# Patient Record
Sex: Female | Born: 1980 | Race: Black or African American | Hispanic: No | Marital: Married | Smoking: Never smoker
Health system: Southern US, Community
[De-identification: ages and names within clinical notes are randomized; demographics above are authoritative.]

## PROBLEM LIST (undated history)

## (undated) DIAGNOSIS — T8859XA Other complications of anesthesia, initial encounter: Secondary | ICD-10-CM

## (undated) DIAGNOSIS — N83209 Unspecified ovarian cyst, unspecified side: Secondary | ICD-10-CM

## (undated) DIAGNOSIS — D649 Anemia, unspecified: Secondary | ICD-10-CM

## (undated) DIAGNOSIS — E282 Polycystic ovarian syndrome: Secondary | ICD-10-CM

## (undated) DIAGNOSIS — K219 Gastro-esophageal reflux disease without esophagitis: Secondary | ICD-10-CM

## (undated) DIAGNOSIS — G709 Myoneural disorder, unspecified: Secondary | ICD-10-CM

## (undated) DIAGNOSIS — M1990 Unspecified osteoarthritis, unspecified site: Secondary | ICD-10-CM

## (undated) DIAGNOSIS — F419 Anxiety disorder, unspecified: Secondary | ICD-10-CM

## (undated) DIAGNOSIS — E079 Disorder of thyroid, unspecified: Secondary | ICD-10-CM

## (undated) DIAGNOSIS — I499 Cardiac arrhythmia, unspecified: Secondary | ICD-10-CM

## (undated) DIAGNOSIS — K829 Disease of gallbladder, unspecified: Secondary | ICD-10-CM

## (undated) HISTORY — PX: CHOLECYSTECTOMY: SHX55

## (undated) HISTORY — PX: WISDOM TOOTH EXTRACTION: SHX21

---

## 2010-06-26 ENCOUNTER — Inpatient Hospital Stay (HOSPITAL_COMMUNITY)
Admission: AD | Admit: 2010-06-26 | Discharge: 2010-06-26 | Payer: Self-pay | Source: Home / Self Care | Attending: Obstetrics & Gynecology | Admitting: Obstetrics & Gynecology

## 2010-06-29 LAB — CBC
HCT: 32.5 % — ABNORMAL LOW (ref 36.0–46.0)
MCV: 76.7 fL — ABNORMAL LOW (ref 78.0–100.0)
Platelets: 285 10*3/uL (ref 150–400)
RBC: 4.24 MIL/uL (ref 3.87–5.11)
WBC: 9.3 10*3/uL (ref 4.0–10.5)

## 2010-06-29 LAB — URINE MICROSCOPIC-ADD ON

## 2010-06-29 LAB — URINALYSIS, ROUTINE W REFLEX MICROSCOPIC
Nitrite: NEGATIVE
Specific Gravity, Urine: >1.03 — ABNORMAL HIGH (ref 1.005–1.030)
pH: 5.5 (ref 5.0–8.0)

## 2010-06-29 LAB — POCT PREGNANCY, URINE: Preg Test, Ur: POSITIVE

## 2010-06-29 LAB — WET PREP, GENITAL

## 2010-06-29 LAB — HCG, QUANTITATIVE, PREGNANCY: hCG, Beta Chain, Quant, S: 11155 m[IU]/mL — ABNORMAL HIGH (ref <5)

## 2010-06-30 ENCOUNTER — Inpatient Hospital Stay (HOSPITAL_COMMUNITY)
Admission: AD | Admit: 2010-06-30 | Discharge: 2010-06-30 | Payer: Self-pay | Source: Home / Self Care | Attending: Obstetrics & Gynecology | Admitting: Obstetrics & Gynecology

## 2010-06-30 ENCOUNTER — Emergency Department (HOSPITAL_BASED_OUTPATIENT_CLINIC_OR_DEPARTMENT_OTHER)
Admission: EM | Admit: 2010-06-30 | Discharge: 2010-07-01 | Payer: Self-pay | Source: Home / Self Care | Admitting: Emergency Medicine

## 2010-06-30 LAB — GC/CHLAMYDIA PROBE AMP, GENITAL
Chlamydia, DNA Probe: NEGATIVE
GC Probe Amp, Genital: NEGATIVE

## 2010-07-03 ENCOUNTER — Ambulatory Visit (HOSPITAL_COMMUNITY): Admission: RE | Admit: 2010-07-03 | Payer: Self-pay | Source: Home / Self Care | Admitting: Obstetrics & Gynecology

## 2010-07-08 ENCOUNTER — Encounter: Payer: Self-pay | Admitting: Obstetrics & Gynecology

## 2010-07-14 ENCOUNTER — Other Ambulatory Visit (HOSPITAL_COMMUNITY): Payer: Self-pay | Admitting: Obstetrics and Gynecology

## 2010-07-14 DIAGNOSIS — Z3682 Encounter for antenatal screening for nuchal translucency: Secondary | ICD-10-CM

## 2010-08-18 ENCOUNTER — Ambulatory Visit (HOSPITAL_COMMUNITY): Payer: Medicaid Other

## 2011-04-28 ENCOUNTER — Emergency Department (HOSPITAL_COMMUNITY)
Admission: EM | Admit: 2011-04-28 | Discharge: 2011-04-28 | Disposition: A | Discharge status: Home / Self Care | Payer: Medicaid Other | Attending: Emergency Medicine | Admitting: Emergency Medicine

## 2011-04-28 ENCOUNTER — Other Ambulatory Visit: Payer: Self-pay

## 2011-04-28 ENCOUNTER — Emergency Department (HOSPITAL_COMMUNITY): Payer: Medicaid Other

## 2011-04-28 DIAGNOSIS — R079 Chest pain, unspecified: Secondary | ICD-10-CM | POA: Insufficient documentation

## 2011-04-28 DIAGNOSIS — R002 Palpitations: Secondary | ICD-10-CM | POA: Insufficient documentation

## 2011-04-28 LAB — POCT I-STAT, CHEM 8
Calcium, Ion: 1.23 mmol/L (ref 1.12–1.32)
Chloride: 105 mEq/L (ref 96–112)
Creatinine, Ser: 1.2 mg/dL — ABNORMAL HIGH (ref 0.50–1.10)
Glucose, Bld: 91 mg/dL (ref 70–99)
HCT: 35 % — ABNORMAL LOW (ref 36.0–46.0)

## 2011-04-28 LAB — POCT I-STAT TROPONIN I: Troponin i, poc: 0 ng/mL (ref 0.00–0.08)

## 2011-04-28 LAB — D-DIMER, QUANTITATIVE: D-Dimer, Quant: 0.38 ug/mL-FEU (ref 0.00–0.48)

## 2011-04-28 MED ORDER — GI COCKTAIL ~~LOC~~
30.0000 mL | Freq: Once | ORAL | Status: AC
  Administered 2011-04-28: 30 mL via ORAL
  Filled 2011-04-28: qty 30

## 2011-04-28 NOTE — ED Provider Notes (Signed)
History    This is a healthy patient with no history of heart disease is presenting to the ED with the chief complaints of chest pressure and palpitations. Patient states, since after her pregnancy in September, she has been noticing heart palpitations and occasional chest pressure. Symptom is more noticeable when she is laying down at night. Sensation is affecting her epigastric region. Symptom usually lasts for about an hour. Patient denies fever, shortness of breath, and nausea, vomiting, diarrhea, abdominal pain, rash and, recent trauma. She has been seen by her primary care doctor and has a Holter monitor for about 2 weeks. She also mentioned that she has a cardiac ultrasound performed last week, but does not know the result.  Today, while sitting at work, she noticed increase chest discomfort which prompted her to come to the ED. Chest pain is nonexertional and does not increase with movement.    CSN: [id number] Arrival date & time: 04/28/2011  4:18 PM   First MD Initiated Contact with Patient 04/28/11 1621      No chief complaint on file.   (Consider location/radiation/quality/duration/timing/severity/associated sxs/prior treatment) Patient is a 30 y.o. female presenting with chest pain. The history is provided by the patient. No language interpreter was used.  Chest Pain The chest pain began 1 - 2 hours ago. Chest pain occurs rarely. The chest pain is improving. At its most intense, the pain is at 8/10. The pain is currently at 2/10. The severity of the pain is moderate. The quality of the pain is described as pressure-like. The pain does not radiate. Chest pain is worsened by stress. Primary symptoms include palpitations. Pertinent negatives for primary symptoms include no fever, no shortness of breath, no cough, no wheezing, no abdominal pain, no nausea and no vomiting.  The palpitations did not occur with shortness of breath.   Pertinent negatives for associated symptoms include no  diaphoresis and no near-syncope. She tried nothing for the symptoms.  Her family medical history is significant for heart disease in family.     No past medical history on file.  No past surgical history on file.  No family history on file.  History  Substance Use Topics  . Smoking status: Not on file  . Smokeless tobacco: Not on file  . Alcohol Use: Not on file    OB History    No data available      Review of Systems  Constitutional: Negative for fever and diaphoresis.  Respiratory: Negative for cough, shortness of breath and wheezing.   Cardiovascular: Positive for chest pain and palpitations. Negative for near-syncope.  Gastrointestinal: Negative for nausea, vomiting and abdominal pain.  All other systems reviewed and are negative.    Allergies  Review of patient's allergies indicates not on file.  Home Medications  No current outpatient prescriptions on file.  There were no vitals taken for this visit.  Physical Exam  Constitutional: She is oriented to person, place, and time. She appears well-developed and well-nourished. No distress.  HENT:  Head: Normocephalic and atraumatic.  Eyes: Conjunctivae are normal.  Neck: Normal range of motion. Neck supple.  Cardiovascular: Normal rate and regular rhythm.  Exam reveals no gallop and no friction rub.   No murmur heard. Pulmonary/Chest: Effort normal and breath sounds normal. No respiratory distress. She has no wheezes. She exhibits no tenderness.  Abdominal: Soft. Bowel sounds are normal. There is no tenderness.  Musculoskeletal: Normal range of motion.  Neurological: She is alert and oriented to person, place, and  time.    ED Course  Procedures (including critical care time)  Labs Reviewed - No data to display No results found.   No diagnosis found.    MDM  Patient chest pain is likely noncardiac related due to her age and presentation. She does appear to be stressed, as her mom passed away at an  early age due to cardiac related disease.    7:42 PM Normal ECG, CXR, CBC, BMP, troponin, D-dimer.  Pt is not having active pain.  Will discharge and have pt fu with her doctor.  Pt voice understanding.  VSS.       Fayrene Helper, PA 04/28/11 1943  Fayrene Helper, PA 04/28/11 1946

## 2011-04-28 NOTE — ED Provider Notes (Signed)
Medical screening examination/treatment/procedure(s) were performed by non-physician practitioner and as supervising physician I was immediately available for consultation/collaboration.  Talana Slatten K Jaydn Fincher-Rasch, MD 04/28/11 2051 

## 2011-04-28 NOTE — Discharge Instructions (Signed)
Chest Pain (Nonspecific) It is often hard to give a specific diagnosis for the cause of chest pain. There is always a chance that your pain could be related to something serious, such as a heart attack or a blood clot in the lungs. You need to follow up with your caregiver for further evaluation. CAUSES   Heartburn.   Pneumonia or bronchitis.   Anxiety and stress.   Inflammation around your heart (pericarditis) or lung (pleuritis or pleurisy).   A blood clot in the lung.   A collapsed lung (pneumothorax). It can develop suddenly on its own (spontaneous pneumothorax) or from injury (trauma) to the chest.  The chest wall is composed of bones, muscles, and cartilage. Any of these can be the source of the pain.  The bones can be bruised by injury.   The muscles or cartilage can be strained by coughing or overwork.   The cartilage can be affected by inflammation and become sore (costochondritis).  DIAGNOSIS  Lab tests or other studies, such as X-rays, an EKG, stress testing, or cardiac imaging, may be needed to find the cause of your pain.  TREATMENT   Treatment depends on what may be causing your chest pain. Treatment may include:   Acid blockers for heartburn.   Anti-inflammatory medicine.   Pain medicine for inflammatory conditions.   Antibiotics if an infection is present.   You may be advised to change lifestyle habits. This includes stopping smoking and avoiding caffeine and chocolate.   You may be advised to keep your head raised (elevated) when sleeping. This reduces the chance of acid going backward from your stomach into your esophagus.   Most of the time, nonspecific chest pain will improve within 2 to 3 days with rest and mild pain medicine.  HOME CARE INSTRUCTIONS   If antibiotics were prescribed, take the full amount even if you start to feel better.   For the next few days, avoid physical activities that bring on chest pain. Continue physical activities as  directed.   Do not smoke cigarettes or drink alcohol until your symptoms are gone.   Only take over-the-counter or prescription medicine for pain, discomfort, or fever as directed by your caregiver.   Follow your caregiver's suggestions for further testing if your chest pain does not go away.   Keep any follow-up appointments you made. If you do not go to an appointment, you could develop lasting (chronic) problems with pain. If there is any problem keeping an appointment, you must call to reschedule.  SEEK MEDICAL CARE IF:   You think you are having problems from the medicine you are taking. Read your medicine instructions carefully.   Your chest pain does not go away, even after treatment.   You develop a rash with blisters on your chest.  SEEK IMMEDIATE MEDICAL CARE IF:   You have increased chest pain or pain that spreads to your arm, neck, jaw, back, or belly (abdomen).   You develop shortness of breath, an increasing cough, or you are coughing up blood.   You have severe back or abdominal pain, feel sick to your stomach (nauseous) or throw up (vomit).   You develop severe weakness, fainting, or chills.   You have an oral temperature above 102 F (38.9 C), not controlled by medicine.  THIS IS AN EMERGENCY. Do not wait to see if the pain will go away. Get medical help at once. Call your local emergency services (911 in [place] Do not drive yourself to   the hospital. MAKE SURE YOU:   Understand these instructions.   Will watch your condition.   Will get help right away if you are not doing well or get worse.  Document Released: 03/03/2005 Document Revised: 02/03/2011 Document Reviewed: 12/28/2007 [employer] Patient Information 2012 [employer]. 

## 2011-09-20 ENCOUNTER — Emergency Department (INDEPENDENT_AMBULATORY_CARE_PROVIDER_SITE_OTHER): Payer: Self-pay

## 2011-09-20 ENCOUNTER — Emergency Department (HOSPITAL_BASED_OUTPATIENT_CLINIC_OR_DEPARTMENT_OTHER)
Admission: EM | Admit: 2011-09-20 | Discharge: 2011-09-21 | Disposition: A | Discharge status: Home / Self Care | Payer: Self-pay | Attending: Emergency Medicine | Admitting: Emergency Medicine

## 2011-09-20 ENCOUNTER — Encounter (HOSPITAL_BASED_OUTPATIENT_CLINIC_OR_DEPARTMENT_OTHER): Payer: Self-pay | Admitting: *Deleted

## 2011-09-20 DIAGNOSIS — K8050 Calculus of bile duct without cholangitis or cholecystitis without obstruction: Secondary | ICD-10-CM

## 2011-09-20 DIAGNOSIS — K8020 Calculus of gallbladder without cholecystitis without obstruction: Secondary | ICD-10-CM | POA: Insufficient documentation

## 2011-09-20 DIAGNOSIS — R1011 Right upper quadrant pain: Secondary | ICD-10-CM | POA: Insufficient documentation

## 2011-09-20 HISTORY — DX: Unspecified ovarian cyst, unspecified side: N83.209

## 2011-09-20 LAB — DIFFERENTIAL
Eosinophils Relative: 1 % (ref 0–5)
Lymphocytes Relative: 19 % (ref 12–46)
Lymphs Abs: 2.8 10*3/uL (ref 0.7–4.0)
Monocytes Absolute: 0.7 10*3/uL (ref 0.1–1.0)
Monocytes Relative: 5 % (ref 3–12)
Neutro Abs: 11.2 10*3/uL — ABNORMAL HIGH (ref 1.7–7.7)

## 2011-09-20 LAB — URINE MICROSCOPIC-ADD ON

## 2011-09-20 LAB — COMPREHENSIVE METABOLIC PANEL
AST: 17 U/L (ref 0–37)
BUN: 8 mg/dL (ref 6–23)
CO2: 27 mEq/L (ref 19–32)
Chloride: 103 mEq/L (ref 96–112)
Creatinine, Ser: 1.1 mg/dL (ref 0.50–1.10)
GFR calc Af Amer: 77 mL/min — ABNORMAL LOW (ref >90)
GFR calc non Af Amer: 67 mL/min — ABNORMAL LOW (ref >90)
Glucose, Bld: 103 mg/dL — ABNORMAL HIGH (ref 70–99)
Total Bilirubin: 0.4 mg/dL (ref 0.3–1.2)

## 2011-09-20 LAB — CBC
HCT: 33.9 % — ABNORMAL LOW (ref 36.0–46.0)
Hemoglobin: 11.2 g/dL — ABNORMAL LOW (ref 12.0–15.0)
MCV: 75.3 fL — ABNORMAL LOW (ref 78.0–100.0)
WBC: 14.9 10*3/uL — ABNORMAL HIGH (ref 4.0–10.5)

## 2011-09-20 LAB — URINALYSIS, ROUTINE W REFLEX MICROSCOPIC
Bilirubin Urine: NEGATIVE
Hgb urine dipstick: NEGATIVE
Ketones, ur: NEGATIVE mg/dL
Nitrite: NEGATIVE
Specific Gravity, Urine: 1.022 (ref 1.005–1.030)
pH: 8 (ref 5.0–8.0)

## 2011-09-20 LAB — LIPASE, BLOOD: Lipase: 13 U/L (ref 11–59)

## 2011-09-20 MED ORDER — SODIUM CHLORIDE 0.9 % IV SOLN
1000.0000 mL | Freq: Once | INTRAVENOUS | Status: AC
  Administered 2011-09-20: 1000 mL via INTRAVENOUS

## 2011-09-20 MED ORDER — SODIUM CHLORIDE 0.9 % IV SOLN
1000.0000 mL | INTRAVENOUS | Status: DC
  Administered 2011-09-21: 1000 mL via INTRAVENOUS

## 2011-09-20 MED ORDER — ONDANSETRON HCL 4 MG/2ML IJ SOLN
4.0000 mg | Freq: Once | INTRAMUSCULAR | Status: AC
  Administered 2011-09-20: 4 mg via INTRAVENOUS
  Filled 2011-09-20: qty 2

## 2011-09-20 MED ORDER — MORPHINE SULFATE 4 MG/ML IJ SOLN
4.0000 mg | Freq: Once | INTRAMUSCULAR | Status: AC
  Administered 2011-09-20: 4 mg via INTRAVENOUS
  Filled 2011-09-20: qty 1

## 2011-09-20 NOTE — ED Notes (Signed)
Pt took MOM at 2pm. Has had 3 BMs since that time.

## 2011-09-20 NOTE — ED Provider Notes (Signed)
This chart was scribed for Kendra Numbers, MD by Wallis Mart. The patient was seen in room MH12/MH12 and the patient's care was started at 10:59 PM.   CSN: [id number]  Arrival date & time 09/20/11  2026   First MD Initiated Contact with Patient 09/20/11 2241      No chief complaint on file.   (Consider location/radiation/quality/duration/timing/severity/associated sxs/prior treatment) HPI  Kendra Fowler is a 31 y.o. female who presents to the Emergency Department complaining of sudden onset, intermittent, gradually worsening abd pain onset this morning. Pt rates the pain as a 7.5/10 and it radiates to her back. Pt c/o of nausea but denies vomiting. Pt took gas ex with mild temporary relief of sx. Pt also took a laxative but denied any constipation, had 2 BM w/ no relief.  Pt denies diarrhea, bloody stools, dysuria. Pt denies any problems with gallbladder or abd surgeries.  There are no other associated symptoms and no other alleviating or aggravating factors.   Past Medical History  Diagnosis Date  . Ovarian cyst     History reviewed. No pertinent past surgical history.  No family history on file.  History  Substance Use Topics  . Smoking status: Never Smoker   . Smokeless tobacco: Not on file  . Alcohol Use: No    OB History    Grav Para Term Preterm Abortions TAB SAB Ect Mult Living                  Review of Systems  Constitutional: Positive for appetite change and fatigue.  HENT: Negative.   Eyes: Negative.   Respiratory: Negative.   Cardiovascular: Negative.   Gastrointestinal: Positive for nausea and abdominal pain.  Genitourinary: Negative.   Musculoskeletal: Negative.   Skin: Negative.   Neurological: Negative.   Hematological: Negative.   Psychiatric/Behavioral: Negative.   All other systems reviewed and are negative.    10 Systems reviewed and all are negative for acute change except as noted in the HPI.    Allergies  Penicillins and  Lobster  Home Medications   Current Outpatient Rx  Name Route Sig Dispense Refill  . THERA M PLUS PO TABS Oral Take 1 tablet by mouth daily.        BP 153/97  Pulse 107  Temp(Src) 99.6 F (37.6 C) (Oral)  Resp 20  SpO2 100%  LMP 09/07/2011  Physical Exam  Nursing note and vitals reviewed. Constitutional: She is oriented to person, place, and time. She appears well-developed and well-nourished. No distress.  HENT:  Head: Normocephalic and atraumatic.  Eyes: EOM are normal. Pupils are equal, round, and reactive to light.  Neck: Normal range of motion. Neck supple. No tracheal deviation present.  Cardiovascular: Normal rate, regular rhythm and normal heart sounds.   Pulmonary/Chest: Effort normal and breath sounds normal. No respiratory distress.  Abdominal: Soft. Bowel sounds are normal. She exhibits no distension.       RUQ tenderness  Musculoskeletal: Normal range of motion. She exhibits no edema.  Neurological: She is alert and oriented to person, place, and time. No sensory deficit.  Skin: Skin is warm and dry.  Psychiatric: She has a normal mood and affect. Her behavior is normal.    ED Course  Procedures (including critical care time) DIAGNOSTIC STUDIES: Oxygen Saturation is 100% on room air, normal by my interpretation.    COORDINATION OF CARE:    Labs Reviewed  URINALYSIS, ROUTINE W REFLEX MICROSCOPIC - Abnormal; Notable for the following:  Leukocytes, UA TRACE (*)    All other components within normal limits  URINE MICROSCOPIC-ADD ON - Abnormal; Notable for the following:    Bacteria, UA FEW (*)    All other components within normal limits  CBC - Abnormal; Notable for the following:    WBC 14.9 (*)    Hemoglobin 11.2 (*)    HCT 33.9 (*)    MCV 75.3 (*)    MCH 24.9 (*)    RDW 16.1 (*)    All other components within normal limits  DIFFERENTIAL - Abnormal; Notable for the following:    Neutro Abs 11.2 (*)    All other components within normal limits    COMPREHENSIVE METABOLIC PANEL - Abnormal; Notable for the following:    Glucose, Bld 103 (*)    GFR calc non Af Amer 67 (*)    GFR calc Af Amer 77 (*)    All other components within normal limits  PREGNANCY, URINE  LIPASE, BLOOD   Ct Abdomen Pelvis W Contrast  09/21/2011  *RADIOLOGY REPORT*  Clinical Data: Right upper quadrant pain, nausea, and low grade fever for 1 day.  CT ABDOMEN AND PELVIS WITH CONTRAST  Technique:  Multidetector CT imaging of the abdomen and pelvis was performed following the standard protocol during bolus administration of intravenous contrast.  Contrast:  100 ml Omnipaque-300  Comparison: None.  Findings: The imaged lung bases are clear.  Noncalcified, gas containing gallstones are noted within the gallbladder.  No evidence of gallbladder wall thickening or pericholecystic fluid by CT.  The liver is within normal limits. There is no biliary ductal dilatation.  Common bile duct is within normal limits for caliber.  The spleen, adrenal glands, pancreas, and kidneys are within normal limits.  The stomach is well opacified with oral contrast appears normal. There is some oral contrast within proximal small bowel loops, but it has not progressed the distal small bowel that time imaging. Bowel loops are normal in caliber.  The appendix is within normal limits.  Urinary bladder is normal.  The uterus and ovaries within normal limits for premenopausal patient, with a probable dominant follicle in the right ovary.  No lymphadenopathy, free fluid, or free intraperitoneal air.  Small fat containing umbilical hernia noted.  There are no herniated bowel loops.  No acute or suspicious bony abnormality.  There is some edema within the subcutaneous fat of the back.  IMPRESSION:  1.  Cholelithiasis.  If there is clinical concern for cholecystitis, right upper quadrant ultrasound could be considered. 2.  Normal appendix.  Original Report Authenticated By: Britta Mccreedy, M.D.     1. Abdominal pain    2. Biliary colic   3. Cholelithiasis       MDM  Patient had history and physical consistent with possible biliary colic. Ultrasound was not available this evening. Laboratory workup showed a white count with no other significant findings. Patient felt much better after IV fluids, morphine, and Zofran. I did feel that CT of the abdomen and pelvis was warranted given the patient's leukocytosis. This showed cholelithiasis but no findings concerning for cholecystitis. This was discussed with the patient. She was comfortable with plan to return tomorrow for formal ultrasound of the right upper quadrant. Patient received one more dose of medications and was discharged with a prescription for Zofran and Percocet. She was told that if she develops fevers or other concerning symptoms she is welcome to return sooner. Patient was discharged in good condition with referral to return  for ultrasound. Referral information for [facility] surgery was also placed on her paperwork so that she might visit with them to discuss elective cholecystectomy.  I personally performed the services described in this documentation, which was scribed in my presence. The recorded information has been reviewed and considered.         Kendra Numbers, MD 09/21/11 [phone]

## 2011-09-20 NOTE — ED Notes (Signed)
Woke this am with pain around her umbilical area. Took a gas ex with some relief. Pain came back worse a few hours later. Had 2 bowel movements with no relief.

## 2011-09-21 ENCOUNTER — Other Ambulatory Visit (HOSPITAL_BASED_OUTPATIENT_CLINIC_OR_DEPARTMENT_OTHER): Payer: Medicaid Other

## 2011-09-21 ENCOUNTER — Ambulatory Visit (INDEPENDENT_AMBULATORY_CARE_PROVIDER_SITE_OTHER)
Admission: RE | Admit: 2011-09-21 | Discharge: 2011-09-21 | Disposition: A | Discharge status: Home / Self Care | Payer: Self-pay | Source: Ambulatory Visit | Attending: Emergency Medicine | Admitting: Emergency Medicine

## 2011-09-21 DIAGNOSIS — R1011 Right upper quadrant pain: Secondary | ICD-10-CM

## 2011-09-21 DIAGNOSIS — K8020 Calculus of gallbladder without cholecystitis without obstruction: Secondary | ICD-10-CM

## 2011-09-21 DIAGNOSIS — R509 Fever, unspecified: Secondary | ICD-10-CM

## 2011-09-21 DIAGNOSIS — R110 Nausea: Secondary | ICD-10-CM

## 2011-09-21 MED ORDER — ONDANSETRON HCL 4 MG/2ML IJ SOLN
4.0000 mg | Freq: Once | INTRAMUSCULAR | Status: AC
  Administered 2011-09-21: 4 mg via INTRAVENOUS
  Filled 2011-09-21: qty 2

## 2011-09-21 MED ORDER — MORPHINE SULFATE 4 MG/ML IJ SOLN
4.0000 mg | Freq: Once | INTRAMUSCULAR | Status: AC
  Administered 2011-09-21: 4 mg via INTRAVENOUS
  Filled 2011-09-21: qty 1

## 2011-09-21 MED ORDER — IOHEXOL 300 MG/ML  SOLN: 100.0000 mL | Freq: Once | INTRAMUSCULAR | Status: DC | PRN

## 2011-09-21 MED ORDER — OXYCODONE-ACETAMINOPHEN 5-325 MG PO TABS: 2.0000 | ORAL_TABLET | ORAL | Status: AC | PRN

## 2011-09-21 MED ORDER — IOHEXOL 300 MG/ML  SOLN: 20.0000 mL | INTRAMUSCULAR | Status: AC

## 2011-09-21 MED ORDER — ONDANSETRON 8 MG PO TBDP: 8.0000 mg | ORAL_TABLET | Freq: Three times a day (TID) | ORAL | Status: AC | PRN

## 2011-09-21 NOTE — Discharge Instructions (Signed)
Biliary Colic  Biliary colic is a steady or irregular pain in the upper abdomen. It is usually under the right side of the rib cage. It happens when gallstones interfere with the normal flow of bile from the gallbladder. Bile is a liquid that helps to digest fats. Bile is made in the liver and stored in the gallbladder. When you eat a meal, bile passes from the gallbladder through the cystic duct and the common bile duct into the small intestine. There, it mixes with partially digested food. If a gallstone blocks either of these ducts, the normal flow of bile is blocked. The muscle cells in the bile duct contract forcefully to try to move the stone. This causes the pain of biliary colic.  SYMPTOMS   A person with biliary colic usually complains of pain in the upper abdomen. This pain can be:   In the center of the upper abdomen just below the breastbone.   In the upper-right part of the abdomen, near the gallbladder and liver.   Spread back toward the right shoulder blade.   Nausea and vomiting.   The pain usually occurs after eating.   Biliary colic is usually triggered by the digestive system's demand for bile. The demand for bile is high after fatty meals. Symptoms can also occur when a person who has been fasting suddenly eats a very large meal. Most episodes of biliary colic pass after 1 to 5 hours. After the most intense pain passes, your abdomen may continue to ache mildly for about 24 hours.  DIAGNOSIS  After you describe your symptoms, your caregiver will perform a physical exam. He or she will pay attention to the upper right portion of your belly (abdomen). This is the area of your liver and gallbladder. An ultrasound will help your caregiver look for gallstones. Specialized scans of the gallbladder may also be done. Blood tests may be done, especially if you have fever or if your pain persists. PREVENTION  Biliary colic can be prevented by controlling the risk factors for gallstones.  Some of these risk factors, such as heredity, increasing age, and pregnancy are a normal part of life. Obesity and a high-fat diet are risk factors you can change through a healthy lifestyle. Women going through menopause who take hormone replacement therapy (estrogen) are also more likely to develop biliary colic. TREATMENT   Pain medication may be prescribed.   You may be encouraged to eat a fat-free diet.   If the first episode of biliary colic is severe, or episodes of colic keep retuning, surgery to remove the gallbladder (cholecystectomy) is usually recommended. This procedure can be done through small incisions using an instrument called a laparoscope. The procedure often requires a brief stay in the hospital. Some people can leave the hospital the same day. It is the most widely used treatment in people troubled by painful gallstones. It is effective and safe, with no complications in more than 90% of cases.   If surgery cannot be done, medication that dissolves gallstones may be used. This medication is expensive and can take months or years to work. Only small stones will dissolve.   Rarely, medication to dissolve gallstones is combined with a procedure called shock-wave lithotripsy. This procedure uses carefully aimed shock waves to break up gallstones. In many people treated with this procedure, gallstones form again within a few years.  PROGNOSIS  If gallstones block your cystic duct or common bile duct, you are at risk for repeated episodes of   biliary colic. There is also a 25% chance that you will develop a gallbladder infection(acute cholecystitis), or some other complication of gallstones within 10 to 20 years. If you have surgery, schedule it at a time that is convenient for you and at a time when you are not sick. HOME CARE INSTRUCTIONS   Drink plenty of clear fluids.   Avoid fatty, greasy or fried foods, or any foods that make your pain worse.   Take medications as directed.    SEEK MEDICAL CARE IF:   You develop a fever over 100.5 F (38.1 C).   Your pain gets worse over time.   You develop nausea that prevents you from eating and drinking.   You develop vomiting.  SEEK IMMEDIATE MEDICAL CARE IF:   You have continuous or severe belly (abdominal) pain which is not relieved with medications.   You develop nausea and vomiting which is not relieved with medications.   You have symptoms of biliary colic and you suddenly develop a fever and shaking chills. This may signal cholecystitis. Call your caregiver immediately.   You develop a yellow color to your skin or the white part of your eyes (jaundice).  Document Released: 10/25/2005 Document Revised: 05/13/2011 Document Reviewed: 01/04/2008 [facility] Patient Information 2012 [place], [place].Cholelithiasis Cholelithiasis (also called gallstones) is a form of gallbladder disease where gallstones form in your gallbladder. The gallbladder is a non-essential organ that stores bile made in the liver, which helps digest fats. Gallstones begin as small crystals and slowly grow into stones. Gallstone pain occurs when the gallbladder spasms, and a gallstone is blocking the duct. Pain can also occur when a stone passes out of the duct.  Women are more likely to develop gallstones than men. Other factors that increase the risk of gallbladder disease are:  Having multiple pregnancies. Physicians sometimes advise removing diseased gallbladders before future pregnancies.   Obesity.   Diets heavy in fried foods and fat.   Increasing age (older than 37).   Prolonged use of medications containing female hormones.   Diabetes mellitus.   Rapid weight loss.   Family history of gallstones (heredity).  SYMPTOMS  Feeling sick to your stomach (nauseous).   Abdominal pain.   Yellowing of the skin (jaundice).   Sudden pain. It may persist from several minutes to several hours.   Worsening pain with deep breathing or when  jarred.   Fever.   Tenderness to the touch.  In some cases, when gallstones do not move into the bile duct, people have no pain or symptoms. These are called "silent" gallstones. TREATMENT In severe cases, emergency surgery may be required. HOME CARE INSTRUCTIONS   Only take over-the-counter or prescription medicines for pain, discomfort, or fever as directed by your caregiver.   Follow a low-fat diet until seen again. Fat causes the gallbladder to contract, which can result in pain.   Follow up as instructed. Attacks are almost always recurrent and surgery is usually required for permanent treatment.  SEEK IMMEDIATE MEDICAL CARE IF:   Your pain increases and is not controlled by medications.   You have an oral temperature above 102 F (38.9 C), not controlled by medication.   You develop nausea and vomiting.  MAKE SURE YOU:   Understand these instructions.   Will watch your condition.   Will get help right away if you are not doing well or get worse.  Document Released: 05/20/2005 Document Revised: 05/13/2011 Document Reviewed: 07/23/2010 [facility] Patient Information 2012 [place], [place].Abdominal Pain Abdominal  pain can be caused by many things. Your caregiver decides the seriousness of your pain by an examination and possibly blood tests and X-rays. Many cases can be observed and treated at home. Most abdominal pain is not caused by a disease and will probably improve without treatment. However, in many cases, more time must pass before a clear cause of the pain can be found. Before that point, it may not be known if you need more testing, or if hospitalization or surgery is needed. HOME CARE INSTRUCTIONS   Do not take laxatives unless directed by your caregiver.   Take pain medicine only as directed by your caregiver.   Only take over-the-counter or prescription medicines for pain, discomfort, or fever as directed by your caregiver.   Try a clear liquid diet (broth, tea,  or water) for as long as directed by your caregiver. Slowly move to a bland diet as tolerated.  SEEK IMMEDIATE MEDICAL CARE IF:   The pain does not go away.   You have a fever.   You keep throwing up (vomiting).   The pain is felt only in portions of the abdomen. Pain in the right side could possibly be appendicitis. In an adult, pain in the left lower portion of the abdomen could be colitis or diverticulitis.   You pass bloody or black tarry stools.  MAKE SURE YOU:   Understand these instructions.   Will watch your condition.   Will get help right away if you are not doing well or get worse.  Document Released: 03/03/2005 Document Revised: 05/13/2011 Document Reviewed: 01/10/2008 [facility] Patient Information 2012 [place], [place].

## 2011-10-12 ENCOUNTER — Ambulatory Visit (INDEPENDENT_AMBULATORY_CARE_PROVIDER_SITE_OTHER): Payer: Medicaid Other | Admitting: Surgery

## 2011-10-19 ENCOUNTER — Encounter (INDEPENDENT_AMBULATORY_CARE_PROVIDER_SITE_OTHER): Payer: Self-pay | Admitting: Surgery

## 2012-10-02 ENCOUNTER — Encounter (HOSPITAL_BASED_OUTPATIENT_CLINIC_OR_DEPARTMENT_OTHER): Payer: Self-pay | Admitting: Emergency Medicine

## 2012-10-02 DIAGNOSIS — Z8742 Personal history of other diseases of the female genital tract: Secondary | ICD-10-CM | POA: Insufficient documentation

## 2012-10-02 DIAGNOSIS — K8020 Calculus of gallbladder without cholecystitis without obstruction: Secondary | ICD-10-CM | POA: Insufficient documentation

## 2012-10-02 DIAGNOSIS — K2990 Gastroduodenitis, unspecified, without bleeding: Secondary | ICD-10-CM | POA: Insufficient documentation

## 2012-10-02 DIAGNOSIS — K2970 Gastritis, unspecified, without bleeding: Secondary | ICD-10-CM | POA: Insufficient documentation

## 2012-10-02 DIAGNOSIS — Z880 Allergy status to penicillin: Secondary | ICD-10-CM | POA: Insufficient documentation

## 2012-10-02 DIAGNOSIS — Z8719 Personal history of other diseases of the digestive system: Secondary | ICD-10-CM | POA: Insufficient documentation

## 2012-10-02 DIAGNOSIS — Z3202 Encounter for pregnancy test, result negative: Secondary | ICD-10-CM | POA: Insufficient documentation

## 2012-10-02 DIAGNOSIS — R142 Eructation: Secondary | ICD-10-CM | POA: Insufficient documentation

## 2012-10-02 DIAGNOSIS — R141 Gas pain: Secondary | ICD-10-CM | POA: Insufficient documentation

## 2012-10-02 NOTE — ED Notes (Signed)
Back pain today 

## 2012-10-03 ENCOUNTER — Encounter (HOSPITAL_BASED_OUTPATIENT_CLINIC_OR_DEPARTMENT_OTHER): Payer: Self-pay | Admitting: *Deleted

## 2012-10-03 ENCOUNTER — Emergency Department (HOSPITAL_BASED_OUTPATIENT_CLINIC_OR_DEPARTMENT_OTHER): Payer: Self-pay

## 2012-10-03 ENCOUNTER — Emergency Department (HOSPITAL_BASED_OUTPATIENT_CLINIC_OR_DEPARTMENT_OTHER)
Admission: EM | Admit: 2012-10-03 | Discharge: 2012-10-03 | Disposition: A | Discharge status: Home / Self Care | Payer: Self-pay | Attending: Emergency Medicine | Admitting: Emergency Medicine

## 2012-10-03 ENCOUNTER — Telehealth (HOSPITAL_COMMUNITY): Payer: Self-pay | Admitting: Emergency Medicine

## 2012-10-03 DIAGNOSIS — K2970 Gastritis, unspecified, without bleeding: Secondary | ICD-10-CM

## 2012-10-03 DIAGNOSIS — K8050 Calculus of bile duct without cholangitis or cholecystitis without obstruction: Secondary | ICD-10-CM

## 2012-10-03 HISTORY — DX: Disease of gallbladder, unspecified: K82.9

## 2012-10-03 LAB — URINALYSIS, ROUTINE W REFLEX MICROSCOPIC
Glucose, UA: NEGATIVE mg/dL
Ketones, ur: NEGATIVE mg/dL
Leukocytes, UA: NEGATIVE
Protein, ur: NEGATIVE mg/dL
Urobilinogen, UA: 0.2 mg/dL (ref 0.0–1.0)

## 2012-10-03 LAB — CBC WITH DIFFERENTIAL/PLATELET
Basophils Relative: 0 % (ref 0–1)
Eosinophils Absolute: 0.2 10*3/uL (ref 0.0–0.7)
Hemoglobin: 10.7 g/dL — ABNORMAL LOW (ref 12.0–15.0)
MCH: 25.5 pg — ABNORMAL LOW (ref 26.0–34.0)
MCHC: 33.9 g/dL (ref 30.0–36.0)
Monocytes Absolute: 0.6 10*3/uL (ref 0.1–1.0)
Monocytes Relative: 6 % (ref 3–12)
Neutrophils Relative %: 58 % (ref 43–77)
RDW: 17 % — ABNORMAL HIGH (ref 11.5–15.5)

## 2012-10-03 LAB — COMPREHENSIVE METABOLIC PANEL
Albumin: 3.4 g/dL — ABNORMAL LOW (ref 3.5–5.2)
BUN: 11 mg/dL (ref 6–23)
Creatinine, Ser: 1 mg/dL (ref 0.50–1.10)
Potassium: 3.9 mEq/L (ref 3.5–5.1)
Total Protein: 6.6 g/dL (ref 6.0–8.3)

## 2012-10-03 LAB — LIPASE, BLOOD: Lipase: 18 U/L (ref 11–59)

## 2012-10-03 MED ORDER — SUCRALFATE 1 GM/10ML PO SUSP: 1.0000 g | Freq: Four times a day (QID) | ORAL | Status: DC

## 2012-10-03 MED ORDER — OXYCODONE-ACETAMINOPHEN 5-325 MG PO TABS
1.0000 | ORAL_TABLET | Freq: Once | ORAL | Status: AC
  Administered 2012-10-03: 1 via ORAL
  Filled 2012-10-03 (×2): qty 1

## 2012-10-03 MED ORDER — GI COCKTAIL ~~LOC~~
30.0000 mL | Freq: Once | ORAL | Status: AC
  Administered 2012-10-03: 30 mL via ORAL
  Filled 2012-10-03: qty 30

## 2012-10-03 MED ORDER — OXYCODONE-ACETAMINOPHEN 5-325 MG PO TABS: 1.0000 | ORAL_TABLET | Freq: Four times a day (QID) | ORAL | Status: DC | PRN

## 2012-10-03 NOTE — ED Notes (Signed)
MD at bedside. 

## 2012-10-03 NOTE — ED Provider Notes (Signed)
History  This chart was scribed for Kendra Heslop Smitty Cords, MD by Shari Heritage, ED Scribe. The patient was seen in room MH07/MH07. Patient's care was started at 0020.   CSN: [id number]  Arrival date & time 10/02/12  2347   First MD Initiated Contact with Patient 10/03/12 0020      Chief Complaint  Patient presents with  . Abdominal Pain    Patient is a 32 y.o. female presenting with abdominal pain. The history is provided by the patient. No language interpreter was used.  Abdominal Pain Pain location:  RUQ and epigastric Pain quality: aching   Pain radiates to:  Back Pain severity:  Moderate (moderate to severe) Onset quality:  Gradual Duration:  4 weeks Timing:  Intermittent Progression:  Partially resolved Chronicity:  Recurrent Context: not recent travel and not trauma   Relieved by:  Nothing Worsened by:  Nothing tried Ineffective treatments:  None tried Associated symptoms: belching and flatus   Associated symptoms: no chest pain, no chills, no constipation, no cough, no diarrhea, no dysuria, no fever, no nausea, no shortness of breath, no sore throat, no vaginal bleeding, no vaginal discharge and no vomiting   Risk factors comment:  Hx of gallbladder disease   HPI Comments: Kendra Fowler is a 32 y.o. female with a history of cholelithiasis who presents to the Emergency Department complaining of worsening, moderate to severe RUQ abdominal pain that radiates to her back for the past month. She states that she has had increased burping, flatus and increased frequency of bowel movement. Per medical records, patient was seen for this problem at [facility] on 09/21/2011 and was told to follow up with a surgeon, but she has not done so. Patient states that she has been trying to eat bland and foods with low fat content. She states that she has tried ibuprofen for pain without relief. She does not take any regular medicines on regular basis, but takes a multivitamin daily.  Patient does not smoke or use alcohol.   Past Medical History  Diagnosis Date  . Ovarian cyst   . Gallbladder attack     No past surgical history on file.  No family history on file.  History  Substance Use Topics  . Smoking status: Never Smoker   . Smokeless tobacco: Not on file  . Alcohol Use: No    OB History   Grav Para Term Preterm Abortions TAB SAB Ect Mult Living                  Review of Systems  Constitutional: Negative for fever and chills.  HENT: Negative for congestion, sore throat, rhinorrhea and neck pain.   Eyes: Negative for visual disturbance.  Respiratory: Negative for cough and shortness of breath.   Cardiovascular: Negative for chest pain.  Gastrointestinal: Positive for abdominal pain and flatus. Negative for nausea, vomiting, diarrhea, constipation, blood in stool and abdominal distention.  Genitourinary: Negative for dysuria, vaginal bleeding, vaginal discharge and difficulty urinating.  Musculoskeletal: Negative for back pain.  Skin: Negative for rash.  Neurological: Negative for headaches.  Psychiatric/Behavioral: Negative for confusion.  All other systems reviewed and are negative.    Allergies  Penicillins and Lobster  Home Medications   Current Outpatient Rx  Name  Route  Sig  Dispense  Refill  . Multiple Vitamins-Minerals (MULTIVITAMINS THER. W/MINERALS) TABS   Oral   Take 1 tablet by mouth daily.             Triage  Vitals: BP 138/90  Pulse 81  Temp(Src) 98.6 F (37 C)  Resp 20  Ht 5\' 7"  (1.702 m)  Wt 268 lb (121.564 kg)  BMI 41.96 kg/m2  SpO2 99%  Physical Exam  Constitutional: She is oriented to person, place, and time. She appears well-developed and well-nourished.  HENT:  Head: Normocephalic and atraumatic.  Mouth/Throat: Oropharynx is clear and moist. No oropharyngeal exudate.  Moist mucous membranes.  Eyes: Conjunctivae and EOM are normal. Pupils are equal, round, and reactive to light.  Neck: Normal range  of motion. Neck supple.  Cardiovascular: Normal rate, regular rhythm and normal heart sounds.   Pulmonary/Chest: Effort normal and breath sounds normal. She has no wheezes. She has no rales.  Abdominal: Soft. She exhibits no distension and no mass. Bowel sounds are increased. There is no tenderness. There is no rebound and no guarding.  Hyperactive bowel sounds in the epigastrium.  Musculoskeletal: Normal range of motion.  Neurological: She is alert and oriented to person, place, and time. She has normal reflexes.  Skin: Skin is warm and dry.  Psychiatric: She has a normal mood and affect.    ED Course  Procedures (including critical care time) DIAGNOSTIC STUDIES: Oxygen Saturation is 99% on room air, normal by my interpretation.    COORDINATION OF CARE: 12:35 AM- Patient informed of current plan for treatment and evaluation and agrees with plan at this time.    Labs Reviewed  CBC WITH DIFFERENTIAL - Abnormal; Notable for the following:    Hemoglobin 10.7 (*)    HCT 31.6 (*)    MCV 75.4 (*)    MCH 25.5 (*)    RDW 17.0 (*)    All other components within normal limits  COMPREHENSIVE METABOLIC PANEL - Abnormal; Notable for the following:    Glucose, Bld 100 (*)    Albumin 3.4 (*)    Total Bilirubin 0.2 (*)    GFR calc non Af Amer 74 (*)    GFR calc Af Amer 86 (*)    All other components within normal limits  URINALYSIS, ROUTINE W REFLEX MICROSCOPIC  PREGNANCY, URINE  LIPASE, BLOOD    No results found.   No diagnosis found.    MDM  Patient with gallstones.  Pain improved post GI cocktail suspect gastritis but never followed up for GB removal will refer to GI and [facility].  Return for worsening symptoms follow up.  Patient verbalizes understanding and agrees to follow up    I personally performed the services described in this documentation, which was scribed in my presence. The recorded information has been reviewed and is accurate.     Jasmine Awe,  MD 10/03/12 [phone]

## 2012-10-03 NOTE — ED Notes (Signed)
Pharmacy called.  Pt just wanting to get pain med Rx filled.  Informed pharmacy needs to fill Rx for Carafate as well.

## 2012-10-03 NOTE — ED Notes (Signed)
Patient transported to X-ray 

## 2012-10-12 ENCOUNTER — Ambulatory Visit (INDEPENDENT_AMBULATORY_CARE_PROVIDER_SITE_OTHER): Payer: BC Managed Care – PPO | Admitting: General Surgery

## 2012-10-12 ENCOUNTER — Encounter (INDEPENDENT_AMBULATORY_CARE_PROVIDER_SITE_OTHER): Payer: Self-pay | Admitting: General Surgery

## 2012-10-12 VITALS — BP 120/80 | HR 68 | Resp 14 | Ht 67.0 in | Wt 274.0 lb

## 2012-10-12 DIAGNOSIS — K8020 Calculus of gallbladder without cholecystitis without obstruction: Secondary | ICD-10-CM

## 2012-10-12 MED ORDER — OXYCODONE-ACETAMINOPHEN 5-325 MG PO TABS: 1.0000 | ORAL_TABLET | Freq: Four times a day (QID) | ORAL | Status: DC | PRN

## 2012-10-12 NOTE — Patient Instructions (Signed)

## 2012-10-12 NOTE — Progress Notes (Signed)
Patient ID: Kendra Fowler, female   DOB: 10-Oct-1980, 32 y.o.   MRN: [id number]  Chief Complaint  Patient presents with  . Cholelithiasis    HPI Kendra Fowler is a 32 y.o. female.   HPI 32 yo AAF referred by Dr Terressa Koyanagi for evaluation of gallbladder problems. States that she started having problems about a year ago. She presented to the emergency room at that time and had a CT scan and ultrasound and was diagnosed with gallstone disease. Since that time she initially had only a gallbladder attack every month or so. She describes the pain is in her epigastric and right upper quadrant radiating to her back. It sometimes associated with nausea. However over the past month or so she has had more frequent episodes. Now she is having almost episodes on a daily or every other day basis. The pain will last for for several hours. She denies any vomiting. She denies any fevers or chills. She did have an episode of diarrhea on Monday when she presented to the emergency department. She had labs and abdominal series. Her labs were normal. She denies any weight loss. She did have one episode of acholic stools. She denies any NSAID use. She denies any difficulty swallowing.over the past year she has been doing diet modification however she is now having pain even with eating a bland diet. Past Medical History  Diagnosis Date  . Ovarian cyst     History reviewed. No pertinent past surgical history.  Family History  Problem Relation Age of Onset  . Breast cancer      Aunt and Grandmother    Social History History  Substance Use Topics  . Smoking status: Never Smoker   . Smokeless tobacco: Not on file  . Alcohol Use: No    Allergies  Allergen Reactions  . Penicillins Anaphylaxis  . Lobster (Shellfish Allergy) Nausea And Vomiting    Current Outpatient Prescriptions  Medication Sig Dispense Refill  . Multiple Vitamins-Minerals (MULTIVITAMINS THER. W/MINERALS) TABS Take 1 tablet by  mouth daily.        Marland Kitchen oxyCODONE-acetaminophen (PERCOCET) 5-325 MG per tablet Take 1 tablet by mouth every 6 (six) hours as needed for pain.  13 tablet  0  . sucralfate (CARAFATE) 1 GM/10ML suspension Take 10 mLs (1 g total) by mouth 4 (four) times daily.  420 mL  0   No current facility-administered medications for this visit.    Review of Systems Review of Systems  Constitutional: Negative for fever, chills and unexpected weight change.  HENT: Negative for hearing loss, congestion, sore throat, trouble swallowing and voice change.   Eyes: Negative for visual disturbance.  Respiratory: Negative for cough and wheezing.   Cardiovascular: Negative for chest pain, palpitations and leg swelling.  Gastrointestinal: Positive for nausea, abdominal pain and diarrhea. Negative for vomiting, constipation, blood in stool, abdominal distention and anal bleeding.  Genitourinary: Negative for hematuria, vaginal bleeding and difficulty urinating.  Musculoskeletal: Negative for arthralgias.  Skin: Negative for rash and wound.  Neurological: Negative for seizures, syncope, facial asymmetry and headaches.  Hematological: Negative for adenopathy. Does not bruise/bleed easily.  Psychiatric/Behavioral: Negative for confusion.    Blood pressure 120/80, pulse 68, resp. rate 14, height 5\' 7"  (1.702 m), weight 274 lb (124.286 kg).  Physical Exam Physical Exam  Vitals reviewed. Constitutional: She is oriented to person, place, and time. She appears well-developed and well-nourished. No distress.  obese  HENT:  Head: Normocephalic and atraumatic.  Right Ear: External ear  normal.  Left Ear: External ear normal.  Eyes: Conjunctivae are normal. No scleral icterus.  Neck: Normal range of motion. Neck supple. No tracheal deviation present. No thyromegaly present.  Cardiovascular: Normal rate, regular rhythm and normal heart sounds.   Pulmonary/Chest: Effort normal and breath sounds normal. No respiratory  distress. She has no wheezes.  Abdominal: Soft. She exhibits no distension. There is no tenderness. There is no rebound and no guarding.  Musculoskeletal: She exhibits no edema.  Neurological: She is alert and oriented to person, place, and time.  Skin: Skin is warm and dry. She is not diaphoretic.  Psychiatric: She has a normal mood and affect. Her behavior is normal. Judgment and thought content normal.    Data Reviewed ED note Labs from 09/2012 - nml cmet abd u/s 2013 COMPLETE ABDOMINAL ULTRASOUND  Comparison: CT 09/21/2011  Findings:  Gallbladder: Multiple gallstones within the gallbladder. 2 cm  stone within the gallbladder neck appears non mobile. No wall  thickening or positive sonographic Murphy's sign.  Common bile duct: Normal caliber, 2 mm.  Liver: No focal lesion identified. Within normal limits in  parenchymal echogenicity.  IVC: Appears normal.  Pancreas: No focal abnormality seen.  Spleen: Within normal limits in size and echotexture.  Right Kidney: Normal in size and parenchymal echogenicity. No  evidence of mass or hydronephrosis.  Left Kidney: Normal in size and parenchymal echogenicity. No  evidence of mass or hydronephrosis.  Abdominal aorta: No aneurysm identified.  IMPRESSION:  Cholelithiasis without sonographic evidence of acute boluses  guidance. There appears to be a non mobile 2 cm stone within the  gallbladder neck.   Assessment    Symptomatic cholelithiasis     Plan    I believe the patient's symptoms are consistent with gallbladder disease.it appears that her diet modification is no longer working.  We discussed gallbladder disease. The patient was given Agricultural engineer. We discussed non-operative and operative management. We discussed the signs & symptoms of acute cholecystitis  I discussed laparoscopic cholecystectomy with IOC in detail.  The patient was given educational material as well as diagrams detailing the procedure.  We  discussed the risks and benefits of a laparoscopic cholecystectomy including, but not limited to bleeding, infection, injury to surrounding structures such as the intestine or liver, bile leak, retained gallstones, need to convert to an open procedure, prolonged diarrhea, blood clots such as  DVT, common bile duct injury, anesthesia risks, and possible need for additional procedures.  We discussed the typical post-operative recovery course. I explained that the likelihood of improvement of their symptoms is good.  She has elected to proceed with surgery.   Mary Sella. Andrey Campanile, MD, FACS General, Bariatric, & Minimally Invasive Surgery [facility], [place]         [facility] 10/12/2012, 2:02 PM

## 2013-04-12 ENCOUNTER — Other Ambulatory Visit: Payer: Self-pay

## 2013-05-19 ENCOUNTER — Emergency Department (HOSPITAL_BASED_OUTPATIENT_CLINIC_OR_DEPARTMENT_OTHER)
Admission: EM | Admit: 2013-05-19 | Discharge: 2013-05-19 | Disposition: A | Discharge status: Home / Self Care | Payer: BC Managed Care – PPO | Attending: Emergency Medicine | Admitting: Emergency Medicine

## 2013-05-19 ENCOUNTER — Encounter (HOSPITAL_BASED_OUTPATIENT_CLINIC_OR_DEPARTMENT_OTHER): Payer: Self-pay | Admitting: Emergency Medicine

## 2013-05-19 DIAGNOSIS — Z3202 Encounter for pregnancy test, result negative: Secondary | ICD-10-CM | POA: Insufficient documentation

## 2013-05-19 DIAGNOSIS — Y9229 Other specified public building as the place of occurrence of the external cause: Secondary | ICD-10-CM | POA: Insufficient documentation

## 2013-05-19 DIAGNOSIS — Z880 Allergy status to penicillin: Secondary | ICD-10-CM | POA: Insufficient documentation

## 2013-05-19 DIAGNOSIS — IMO0002 Reserved for concepts with insufficient information to code with codable children: Secondary | ICD-10-CM | POA: Insufficient documentation

## 2013-05-19 DIAGNOSIS — Y9389 Activity, other specified: Secondary | ICD-10-CM | POA: Insufficient documentation

## 2013-05-19 DIAGNOSIS — K8020 Calculus of gallbladder without cholecystitis without obstruction: Secondary | ICD-10-CM | POA: Insufficient documentation

## 2013-05-19 DIAGNOSIS — S0990XA Unspecified injury of head, initial encounter: Secondary | ICD-10-CM | POA: Insufficient documentation

## 2013-05-19 DIAGNOSIS — M549 Dorsalgia, unspecified: Secondary | ICD-10-CM | POA: Insufficient documentation

## 2013-05-19 DIAGNOSIS — Z79899 Other long term (current) drug therapy: Secondary | ICD-10-CM | POA: Insufficient documentation

## 2013-05-19 DIAGNOSIS — K8050 Calculus of bile duct without cholangitis or cholecystitis without obstruction: Secondary | ICD-10-CM

## 2013-05-19 LAB — URINALYSIS, ROUTINE W REFLEX MICROSCOPIC
Bilirubin Urine: NEGATIVE
Ketones, ur: NEGATIVE mg/dL
Leukocytes, UA: NEGATIVE
Nitrite: NEGATIVE
Protein, ur: NEGATIVE mg/dL
Specific Gravity, Urine: 1.019 (ref 1.005–1.030)
Urobilinogen, UA: 1 mg/dL (ref 0.0–1.0)
pH: 6 (ref 5.0–8.0)

## 2013-05-19 LAB — CBC WITH DIFFERENTIAL/PLATELET
Basophils Absolute: 0 10*3/uL (ref 0.0–0.1)
HCT: 33.4 % — ABNORMAL LOW (ref 36.0–46.0)
Hemoglobin: 10.7 g/dL — ABNORMAL LOW (ref 12.0–15.0)
Lymphocytes Relative: 40 % (ref 12–46)
Monocytes Absolute: 0.4 10*3/uL (ref 0.1–1.0)
Monocytes Relative: 5 % (ref 3–12)
Neutro Abs: 4.9 10*3/uL (ref 1.7–7.7)
WBC: 9.1 10*3/uL (ref 4.0–10.5)

## 2013-05-19 LAB — COMPREHENSIVE METABOLIC PANEL
AST: 14 U/L (ref 0–37)
BUN: 10 mg/dL (ref 6–23)
CO2: 26 mEq/L (ref 19–32)
Chloride: 106 mEq/L (ref 96–112)
Creatinine, Ser: 1 mg/dL (ref 0.50–1.10)
GFR calc non Af Amer: 74 mL/min — ABNORMAL LOW (ref >90)
Total Bilirubin: 0.2 mg/dL — ABNORMAL LOW (ref 0.3–1.2)

## 2013-05-19 MED ORDER — KETOROLAC TROMETHAMINE 30 MG/ML IJ SOLN
30.0000 mg | Freq: Once | INTRAMUSCULAR | Status: AC
  Administered 2013-05-19: 30 mg via INTRAVENOUS
  Filled 2013-05-19: qty 1

## 2013-05-19 MED ORDER — ONDANSETRON HCL 4 MG/2ML IJ SOLN
4.0000 mg | Freq: Once | INTRAMUSCULAR | Status: AC
  Administered 2013-05-19: 4 mg via INTRAVENOUS
  Filled 2013-05-19: qty 2

## 2013-05-19 MED ORDER — ONDANSETRON 4 MG PO TBDP: ORAL_TABLET | ORAL | Status: DC

## 2013-05-19 MED ORDER — SODIUM CHLORIDE 0.9 % IV BOLUS (SEPSIS)
500.0000 mL | Freq: Once | INTRAVENOUS | Status: AC
  Administered 2013-05-19: 500 mL via INTRAVENOUS

## 2013-05-19 NOTE — ED Notes (Addendum)
Pt c/o head injury by shovel x 1 hr ago c/o h/a

## 2013-05-19 NOTE — ED Provider Notes (Signed)
CSN: [id number]     Arrival date & time 05/19/13  1444 History  This chart was scribed for Kendra Skeens, MD by Dorothey Baseman, ED Scribe. This patient was seen in room MH08/MH08 and the patient's care was started at 3:58 PM.    Chief Complaint  Patient presents with  . Head Injury   Patient is a 32 y.o. female presenting with head injury. The history is provided by the patient. No language interpreter was used.  Head Injury Mechanism of injury: direct blow   Pain details:    Severity:  Moderate   Timing:  Constant Chronicity:  New Associated symptoms: headache and nausea   Associated symptoms: no neck pain, no numbness and no vomiting    HPI Comments: Kendra Fowler is a 32 y.o. female who presents to the Emergency Department complaining of a head injury that occurred around 3 hours ago when she states that she was accidentally hit in the head by a plastic shovel that fell in the grocery store. Patient reports an associated headache with some blurred vision. She denies any lacerations or bleeding, emesis, weakness, numbness, abnormal gait, chest pain, neck pain, fever. Patient has no other pertinent medical history.   Patient reports some nausea, right-sided lower back pain, abdominal pain, and intermittent diarrhea for the past week secondary to some gallbladder problems, but she denies any recent changes. Patient also reports a history of cholelithiasis.   Past Medical History  Diagnosis Date  . Ovarian cyst    History reviewed. No pertinent past surgical history. Family History  Problem Relation Age of Onset  . Breast cancer      Aunt and Grandmother   History  Substance Use Topics  . Smoking status: Never Smoker   . Smokeless tobacco: Not on file  . Alcohol Use: No   OB History   Grav Para Term Preterm Abortions TAB SAB Ect Mult Living                 Review of Systems  Constitutional: Negative for fever.  Eyes: Positive for visual disturbance.  Cardiovascular:  Negative for chest pain.  Gastrointestinal: Positive for nausea, abdominal pain and diarrhea. Negative for vomiting.  Musculoskeletal: Positive for back pain. Negative for gait problem and neck pain.  Skin: Negative for wound.  Neurological: Positive for headaches. Negative for weakness and numbness.  All other systems reviewed and are negative.    Allergies  Penicillins and Lobster  Home Medications   Current Outpatient Rx  Name  Route  Sig  Dispense  Refill  . Multiple Vitamins-Minerals (MULTIVITAMINS THER. W/MINERALS) TABS   Oral   Take 1 tablet by mouth daily.           Marland Kitchen oxyCODONE-acetaminophen (PERCOCET) 5-325 MG per tablet   Oral   Take 1 tablet by mouth every 6 (six) hours as needed for pain.   13 tablet   0   . sucralfate (CARAFATE) 1 GM/10ML suspension   Oral   Take 10 mLs (1 g total) by mouth 4 (four) times daily.   420 mL   0    Triage Vitals: BP 139/97  Pulse 73  Temp(Src) 98.3 F (36.8 C) (Oral)  Resp 16  Ht 5\' 7"  (1.702 m)  Wt 288 lb (130.636 kg)  BMI 45.10 kg/m2  SpO2 100%  LMP 05/06/2013  Physical Exam  Nursing note and vitals reviewed. Constitutional: She is oriented to person, place, and time. She appears well-developed and well-nourished. No distress.  HENT:  Head: Normocephalic and atraumatic.  Eyes: Conjunctivae and EOM are normal. Pupils are equal, round, and reactive to light. No scleral icterus.  Neck: Normal range of motion. Neck supple.  Pulmonary/Chest: Effort normal. No respiratory distress.  Abdominal: Soft. She exhibits no distension. There is tenderness. There is no rebound and no guarding.  Mild tenderness to the RUQ.   Musculoskeletal: Normal range of motion.  Neurological: She is alert and oriented to person, place, and time. No cranial nerve deficit.  No drift. Normal finger to nose bilaterally. Equal strength to the bilateral upper and lower extremities.   Skin: Skin is warm and dry.  Psychiatric: She has a normal mood  and affect. Her behavior is normal.    ED Course  Procedures (including critical care time)  EMERGENCY DEPARTMENT BILIARY ULTRASOUND INTERPRETATION "Study: Limited Abdominal Ultrasound of the gallbladder and common bile duct."  INDICATIONS: RUQ pain and Nausea Indication: Multiple views of the gallbladder and common bile duct were obtained in real-time with a Multi-frequency probe." PERFORMED BY:  Myself IMAGES ARCHIVED?: Yes FINDINGS: Gallstones present, Gallbladder wall normal in thickness and Sonographic Murphy's sign absent LIMITATIONS: Body Habitus and Bowel Gas INTERPRETATION: Cholelithiasis 1 small gallstone near neck   DIAGNOSTIC STUDIES: Oxygen Saturation is 100% on room air, normal by my interpretation.    COORDINATION OF CARE: 4:02 PM- Discussed that symptoms are likely due to a mild concussion, but that imaging will not be necessary today in the ED. Will order blood labs. Discussed treatment plan with patient at bedside and patient verbalized agreement.   6:15 PM- Discussed that lab results were normal. Performed abdominal ultrasound one small gallstone in neck. Discussed treatment plan with patient at bedside and patient verbalized agreement.    Labs Review Labs Reviewed  CBC WITH DIFFERENTIAL - Abnormal; Notable for the following:    Hemoglobin 10.7 (*)    HCT 33.4 (*)    MCH 25.7 (*)    All other components within normal limits  COMPREHENSIVE METABOLIC PANEL - Abnormal; Notable for the following:    Albumin 3.4 (*)    Total Bilirubin 0.2 (*)    GFR calc non Af Amer 74 (*)    GFR calc Af Amer 86 (*)    All other components within normal limits  LIPASE, BLOOD  URINALYSIS, ROUTINE W REFLEX MICROSCOPIC  PREGNANCY, URINE   Imaging Review No results found.  EKG Interpretation   None       MDM   1. Biliary colic   2. Head injury, initial encounter    I personally performed the services described in this documentation, which was scribed in my presence.  The recorded information has been reviewed and is accurate.  Abdo pain- mild - labs okay, no fever, well appearing, biliary colic, US done. [facility]Up surgery outpt.   Head injury - low risk, normal neuro, no CT indicated at this time, no syncope or vomiting.  Results and differential diagnosis were discussed with the patient. Close follow up outpatient was discussed, patient comfortable with the plan.      Kendra Skeens, MD 05/20/13 [phone]

## 2013-08-18 ENCOUNTER — Encounter (HOSPITAL_BASED_OUTPATIENT_CLINIC_OR_DEPARTMENT_OTHER): Payer: Self-pay | Admitting: Emergency Medicine

## 2013-08-18 ENCOUNTER — Emergency Department (HOSPITAL_BASED_OUTPATIENT_CLINIC_OR_DEPARTMENT_OTHER)
Admission: EM | Admit: 2013-08-18 | Discharge: 2013-08-18 | Disposition: A | Discharge status: Home / Self Care | Payer: BC Managed Care – PPO | Attending: Emergency Medicine | Admitting: Emergency Medicine

## 2013-08-18 DIAGNOSIS — Z880 Allergy status to penicillin: Secondary | ICD-10-CM | POA: Insufficient documentation

## 2013-08-18 DIAGNOSIS — Z8742 Personal history of other diseases of the female genital tract: Secondary | ICD-10-CM | POA: Insufficient documentation

## 2013-08-18 DIAGNOSIS — M654 Radial styloid tenosynovitis [de Quervain]: Secondary | ICD-10-CM | POA: Insufficient documentation

## 2013-08-18 MED ORDER — NAPROXEN 500 MG PO TABS: 500.0000 mg | ORAL_TABLET | Freq: Two times a day (BID) | ORAL | Status: DC

## 2013-08-18 NOTE — Discharge Instructions (Signed)
Wear the splint at night, and during the day as needed. Avoid repetitive motion with your left hand. Apply ice to 3 times per day on days that your hand causes you pain.   De Quervain's Disease Suzette BattiestDe Quervain's disease is a condition often seen in racquet sports where there is a soreness (inflammation) in the cord like structures (tendons) which attach muscle to bone on the thumb side of the wrist. There may be a tightening of the tissuesaround the tendons. This condition is often helped by giving up or modifying the activity which caused it. When conservative treatment does not help, surgery may be required. Conservative treatment could include changes in the activity which brought about the problem or made it worse. Anti-inflammatory medications and injections may be used to help decrease the inflammation and help with pain control. Your caregiver will help you determine which is best for you. DIAGNOSIS  Often the diagnosis (learning what is wrong) can be made by examination. Sometimes x-rays are required. HOME CARE INSTRUCTIONS   Apply ice to the sore area for 15-20 minutes, 03-04 times per day while awake. Put the ice in a plastic bag and place a towel between the bag of ice and your skin. This is especially helpful if it can be done after all activities involving the sore wrist.  Temporary splinting may help.  Only take over-the-counter or prescription medicines for pain, discomfort or fever as directed by your caregiver. SEEK MEDICAL CARE IF:   Pain relief is not obtained with medications, or if you have increasing pain and seem to be getting worse rather than better. MAKE SURE YOU:   Understand these instructions.  Will watch your condition.  Will get help right away if you are not doing well or get worse. Document Released: 02/16/2001 Document Revised: 08/16/2011 Document Reviewed: 05/24/2005 [facility] Patient Information 2014 [employer] [place], [place].

## 2013-08-18 NOTE — ED Notes (Signed)
Pt states she has had left hand/fingers/thumb pain for over a year intermittent.  It has become more constant over the last month.  Pulses good.

## 2013-08-18 NOTE — ED Provider Notes (Signed)
CSN: [id number]     Arrival date & time 08/18/13  1026 History   First MD Initiated Contact with Patient 08/18/13 1028     Chief Complaint  Patient presents with  . Hand Pain      HPI  She presents with pain in her left hand. At present for over 6 months. 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 She does use her hands from work. She has a minimum amount typing. No excessive repetitive motion. No fall or injuries or twisting or strikes to her hand. No other areas of joint pain now, or store clear.  Past Medical History  Diagnosis Date  . Ovarian cyst   . Gallbladder attack    No past surgical history on file. Family History  Problem Relation Age of Onset  . Breast cancer      Aunt and Grandmother   History  Substance Use Topics  . Smoking status: Never Smoker   . Smokeless tobacco: Not on file  . Alcohol Use: No   OB History   Grav Para Term Preterm Abortions TAB SAB Ect Mult Living                 Review of Systems  Musculoskeletal: Positive for arthralgias. Negative for back pain, gait problem, joint swelling and myalgias.      Allergies  Penicillins and Lobster  Home Medications   Current Outpatient Rx  Name  Route  Sig  Dispense  Refill  . Multiple Vitamins-Minerals (MULTIVITAMINS THER. W/MINERALS) TABS   Oral   Take 1 tablet by mouth daily.           . naproxen (NAPROSYN) 500 MG tablet   Oral   Take 1 tablet (500 mg total) by mouth 2 (two) times daily.   30 tablet   0    BP 147/84  Pulse 76  Temp(Src) 98.8 F (37.1 C) (Oral)  Resp 20  Ht 5\' 7"  (1.702 m)  Wt 285 lb (129.275 kg)  BMI 44.63 kg/m2  SpO2 100%  LMP 08/02/2013 Physical Exam  Musculoskeletal:       Hands: Positive Finkelstein sign. Tender along the radial styloid tendons of the thumb. No numbness. Good cap refill. No localized swelling.    ED Course  Procedures (including critical care time) Labs Review Labs Reviewed - No data to display Imaging Review No results found.   EKG Interpretation None      MDM   Final diagnoses:  De Quervain's tenosynovitis, left    With TIA thumb spica splint. Will have her use ice. Daily naproxen. Avoid excessive, route, or repetitive motion. Primary care followup.    Rolland PorterMark Nitasha Jewel, MD 08/18/13 1100

## 2013-09-29 ENCOUNTER — Other Ambulatory Visit (HOSPITAL_BASED_OUTPATIENT_CLINIC_OR_DEPARTMENT_OTHER): Payer: Self-pay | Admitting: Emergency Medicine

## 2013-09-29 ENCOUNTER — Encounter (HOSPITAL_BASED_OUTPATIENT_CLINIC_OR_DEPARTMENT_OTHER): Payer: Self-pay | Admitting: Emergency Medicine

## 2013-09-29 ENCOUNTER — Ambulatory Visit (HOSPITAL_BASED_OUTPATIENT_CLINIC_OR_DEPARTMENT_OTHER)
Admission: RE | Admit: 2013-09-29 | Discharge: 2013-09-29 | Disposition: A | Discharge status: Home / Self Care | Payer: BC Managed Care – PPO | Source: Ambulatory Visit | Attending: Emergency Medicine | Admitting: Emergency Medicine

## 2013-09-29 ENCOUNTER — Emergency Department (HOSPITAL_BASED_OUTPATIENT_CLINIC_OR_DEPARTMENT_OTHER)
Admission: EM | Admit: 2013-09-29 | Discharge: 2013-09-29 | Disposition: A | Discharge status: Home / Self Care | Payer: BC Managed Care – PPO | Attending: Emergency Medicine | Admitting: Emergency Medicine

## 2013-09-29 DIAGNOSIS — Z880 Allergy status to penicillin: Secondary | ICD-10-CM | POA: Insufficient documentation

## 2013-09-29 DIAGNOSIS — Z87898 Personal history of other specified conditions: Secondary | ICD-10-CM

## 2013-09-29 DIAGNOSIS — R1011 Right upper quadrant pain: Secondary | ICD-10-CM | POA: Insufficient documentation

## 2013-09-29 DIAGNOSIS — Z791 Long term (current) use of non-steroidal anti-inflammatories (NSAID): Secondary | ICD-10-CM | POA: Insufficient documentation

## 2013-09-29 DIAGNOSIS — K8020 Calculus of gallbladder without cholecystitis without obstruction: Secondary | ICD-10-CM | POA: Insufficient documentation

## 2013-09-29 DIAGNOSIS — K8050 Calculus of bile duct without cholangitis or cholecystitis without obstruction: Secondary | ICD-10-CM

## 2013-09-29 DIAGNOSIS — Z3202 Encounter for pregnancy test, result negative: Secondary | ICD-10-CM | POA: Insufficient documentation

## 2013-09-29 DIAGNOSIS — Z8742 Personal history of other diseases of the female genital tract: Secondary | ICD-10-CM | POA: Insufficient documentation

## 2013-09-29 LAB — COMPREHENSIVE METABOLIC PANEL
ALT: 10 U/L (ref 0–35)
AST: 13 U/L (ref 0–37)
Albumin: 3.2 g/dL — ABNORMAL LOW (ref 3.5–5.2)
Alkaline Phosphatase: 54 U/L (ref 39–117)
BUN: 9 mg/dL (ref 6–23)
CALCIUM: 8.7 mg/dL (ref 8.4–10.5)
CO2: 26 mEq/L (ref 19–32)
Chloride: 106 mEq/L (ref 96–112)
Creatinine, Ser: 1 mg/dL (ref 0.50–1.10)
GFR calc Af Amer: 85 mL/min — ABNORMAL LOW (ref >90)
GFR calc non Af Amer: 74 mL/min — ABNORMAL LOW (ref >90)
Glucose, Bld: 98 mg/dL (ref 70–99)
Potassium: 3.7 mEq/L (ref 3.7–5.3)
SODIUM: 142 meq/L (ref 137–147)
TOTAL PROTEIN: 6.5 g/dL (ref 6.0–8.3)
Total Bilirubin: 0.3 mg/dL (ref 0.3–1.2)

## 2013-09-29 LAB — CBC WITH DIFFERENTIAL/PLATELET
BASOS ABS: 0 10*3/uL (ref 0.0–0.1)
Basophils Relative: 0 % (ref 0–1)
Eosinophils Absolute: 0.1 10*3/uL (ref 0.0–0.7)
Eosinophils Relative: 2 % (ref 0–5)
HEMATOCRIT: 33.2 % — AB (ref 36.0–46.0)
HEMOGLOBIN: 10.7 g/dL — AB (ref 12.0–15.0)
LYMPHS ABS: 2.8 10*3/uL (ref 0.7–4.0)
LYMPHS PCT: 38 % (ref 12–46)
MCH: 25.7 pg — ABNORMAL LOW (ref 26.0–34.0)
MCHC: 32.2 g/dL (ref 30.0–36.0)
MCV: 79.6 fL (ref 78.0–100.0)
MONO ABS: 0.4 10*3/uL (ref 0.1–1.0)
MONOS PCT: 5 % (ref 3–12)
NEUTROS ABS: 4.1 10*3/uL (ref 1.7–7.7)
Neutrophils Relative %: 55 % (ref 43–77)
Platelets: 249 10*3/uL (ref 150–400)
RBC: 4.17 MIL/uL (ref 3.87–5.11)
RDW: 15.1 % (ref 11.5–15.5)
WBC: 7.3 10*3/uL (ref 4.0–10.5)

## 2013-09-29 LAB — URINALYSIS, ROUTINE W REFLEX MICROSCOPIC
BILIRUBIN URINE: NEGATIVE
GLUCOSE, UA: NEGATIVE mg/dL
Ketones, ur: NEGATIVE mg/dL
LEUKOCYTES UA: NEGATIVE
NITRITE: NEGATIVE
PH: 5.5 (ref 5.0–8.0)
Protein, ur: NEGATIVE mg/dL
SPECIFIC GRAVITY, URINE: 1.019 (ref 1.005–1.030)
Urobilinogen, UA: 1 mg/dL (ref 0.0–1.0)

## 2013-09-29 LAB — URINE MICROSCOPIC-ADD ON

## 2013-09-29 LAB — LIPASE, BLOOD: Lipase: 14 U/L (ref 11–59)

## 2013-09-29 LAB — PREGNANCY, URINE: Preg Test, Ur: NEGATIVE

## 2013-09-29 MED ORDER — ONDANSETRON HCL 4 MG/2ML IJ SOLN
4.0000 mg | Freq: Once | INTRAMUSCULAR | Status: AC
  Administered 2013-09-29: 4 mg via INTRAVENOUS
  Filled 2013-09-29: qty 2

## 2013-09-29 MED ORDER — HYDROCODONE-ACETAMINOPHEN 5-325 MG PO TABS: 1.0000 | ORAL_TABLET | Freq: Four times a day (QID) | ORAL | Status: DC | PRN

## 2013-09-29 MED ORDER — FENTANYL CITRATE 0.05 MG/ML IJ SOLN
100.0000 ug | Freq: Once | INTRAMUSCULAR | Status: AC
Start: 2013-09-29 — End: 2013-09-29
  Administered 2013-09-29: 100 ug via INTRAVENOUS
  Filled 2013-09-29: qty 2

## 2013-09-29 MED ORDER — SODIUM CHLORIDE 0.9 % IV SOLN
INTRAVENOUS | Status: DC
  Administered 2013-09-29: 03:00:00 via INTRAVENOUS

## 2013-09-29 MED ORDER — ONDANSETRON 8 MG PO TBDP: 8.0000 mg | ORAL_TABLET | Freq: Three times a day (TID) | ORAL | Status: DC | PRN

## 2013-09-29 NOTE — ED Notes (Signed)
Abdominal pain x 2 days, nausea and vomiting started today, pt reports history of gallbladder "issue" but states this pain does not feel like her gallbladder

## 2013-09-29 NOTE — Discharge Instructions (Signed)

## 2013-09-29 NOTE — ED Provider Notes (Signed)
CSN: [id number]     Arrival date & time 09/29/13  0136 History   First MD Initiated Contact with Patient 09/29/13 0308     Chief Complaint  Patient presents with  . Abdominal Pain     (Consider location/radiation/quality/duration/timing/severity/associated sxs/prior Treatment) HPI This is a 33 year old female with a history of "gallbladder trouble". She is here with a two-day history of right upper quadrant bowel pain. The pain has been coming and going. She's not sure if it is worse with eating because she has not had much of an appetite. The pain is worse with movement and palpation. She has had episodes of diarrhea, nausea and vomiting associated with the pain. She is not aware of having a fever. She is currently on her menses. She describes the pain as sharp and believes it is related to her gallbladder although she has never had an episode this severe. She rates her pain at an 8/10 at worst 6/10 presently.  Past Medical History  Diagnosis Date  . Ovarian cyst   . Gallbladder attack    History reviewed. No pertinent past surgical history. Family History  Problem Relation Age of Onset  . Breast cancer      Aunt and Grandmother   History  Substance Use Topics  . Smoking status: Never Smoker   . Smokeless tobacco: Not on file  . Alcohol Use: No   OB History   Grav Para Term Preterm Abortions TAB SAB Ect Mult Living                 Review of Systems  All other systems reviewed and are negative.   Allergies  Penicillins and Lobster  Home Medications   Prior to Admission medications   Medication Sig Start Date End Date Taking? Authorizing Provider  Multiple Vitamins-Minerals (MULTIVITAMINS THER. W/MINERALS) TABS Take 1 tablet by mouth daily.     Yes Historical Provider, MD  naproxen (NAPROSYN) 500 MG tablet Take 1 tablet (500 mg total) by mouth 2 (two) times daily. 08/18/13  Yes Rolland Porter, MD   BP 125/91  Pulse 74  Temp(Src) 98.3 F (36.8 C) (Oral)  Resp 18  Ht 5'  7" (1.702 m)  Wt 285 lb (129.275 kg)  BMI 44.63 kg/m2  SpO2 100%  LMP 09/29/2013  Physical Exam General: Well-developed, obese female in no acute distress; appearance consistent with age of record HENT: normocephalic; atraumatic Eyes: pupils equal, round and reactive to light; extraocular muscles intact Neck: supple Heart: regular rate and rhythm Lungs: clear to auscultation bilaterally Abdomen: soft; obese; right upper quadrant tenderness; no masses or hepatosplenomegaly; bowel sounds present Extremities: No deformity; full range of motion; pulses normal Neurologic: Awake, alert and oriented; motor function intact in all extremities and symmetric; no facial droop Skin: Warm and dry Psychiatric: Normal mood and affect    ED Course  Procedures (including critical care time)   MDM   Nursing notes and vitals signs, including pulse oximetry, reviewed.  Summary of this visit's results, reviewed by myself:  Labs:  Results for orders placed during the hospital encounter of 09/29/13 (from the past 24 hour(s))  URINALYSIS, ROUTINE W REFLEX MICROSCOPIC     Status: Abnormal   Collection Time    09/29/13  1:20 AM      Result Value Ref Range   Color, Urine YELLOW  YELLOW   APPearance CLOUDY (*) CLEAR   Specific Gravity, Urine 1.019  1.005 - 1.030   pH 5.5  5.0 - 8.0  Glucose, UA NEGATIVE  NEGATIVE mg/dL   Hgb urine dipstick LARGE (*) NEGATIVE   Bilirubin Urine NEGATIVE  NEGATIVE   Ketones, ur NEGATIVE  NEGATIVE mg/dL   Protein, ur NEGATIVE  NEGATIVE mg/dL   Urobilinogen, UA 1.0  0.0 - 1.0 mg/dL   Nitrite NEGATIVE  NEGATIVE   Leukocytes, UA NEGATIVE  NEGATIVE  PREGNANCY, URINE     Status: None   Collection Time    09/29/13  1:20 AM      Result Value Ref Range   Preg Test, Ur NEGATIVE  NEGATIVE  URINE MICROSCOPIC-ADD ON     Status: Abnormal   Collection Time    09/29/13  1:20 AM      Result Value Ref Range   Squamous Epithelial / LPF RARE  RARE   WBC, UA 0-2  <3 WBC/hpf    RBC / HPF TOO NUMEROUS TO COUNT  <3 RBC/hpf   Bacteria, UA FEW (*) RARE  COMPREHENSIVE METABOLIC PANEL     Status: Abnormal   Collection Time    09/29/13  3:20 AM      Result Value Ref Range   Sodium 142  137 - 147 mEq/L   Potassium 3.7  3.7 - 5.3 mEq/L   Chloride 106  96 - 112 mEq/L   CO2 26  19 - 32 mEq/L   Glucose, Bld 98  70 - 99 mg/dL   BUN 9  6 - 23 mg/dL   Creatinine, Ser 9.141.00  0.50 - 1.10 mg/dL   Calcium 8.7  8.4 - 78.210.5 mg/dL   Total Protein 6.5  6.0 - 8.3 g/dL   Albumin 3.2 (*) 3.5 - 5.2 g/dL   AST 13  0 - 37 U/L   ALT 10  0 - 35 U/L   Alkaline Phosphatase 54  39 - 117 U/L   Total Bilirubin 0.3  0.3 - 1.2 mg/dL   GFR calc non Af Amer 74 (*) >90 mL/min   GFR calc Af Amer 85 (*) >90 mL/min  CBC WITH DIFFERENTIAL     Status: Abnormal   Collection Time    09/29/13  3:20 AM      Result Value Ref Range   WBC 7.3  4.0 - 10.5 K/uL   RBC 4.17  3.87 - 5.11 MIL/uL   Hemoglobin 10.7 (*) 12.0 - 15.0 g/dL   HCT 95.633.2 (*) 21.336.0 - 08.646.0 %   MCV 79.6  78.0 - 100.0 fL   MCH 25.7 (*) 26.0 - 34.0 pg   MCHC 32.2  30.0 - 36.0 g/dL   RDW 57.815.1  46.911.5 - 62.915.5 %   Platelets 249  150 - 400 K/uL   Neutrophils Relative % 55  43 - 77 %   Neutro Abs 4.1  1.7 - 7.7 K/uL   Lymphocytes Relative 38  12 - 46 %   Lymphs Abs 2.8  0.7 - 4.0 K/uL   Monocytes Relative 5  3 - 12 %   Monocytes Absolute 0.4  0.1 - 1.0 K/uL   Eosinophils Relative 2  0 - 5 %   Eosinophils Absolute 0.1  0.0 - 0.7 K/uL   Basophils Relative 0  0 - 1 %   Basophils Absolute 0.0  0.0 - 0.1 K/uL  LIPASE, BLOOD     Status: None   Collection Time    09/29/13  3:20 AM      Result Value Ref Range   Lipase 14  11 - 59 U/L   4:05 AM  Pain and nausea improved with IV medications. Her lab studies are within normal limits. We will have her return later this morning for abdominal ultrasound to evaluate for cholecystitis.     Hanley SeamenJohn L Tyeesha Riker, MD 09/29/13 [phone]

## 2014-05-27 IMAGING — US US ABDOMEN COMPLETE
1 series · 13 of 25 positions shown · non-contrast
Comparison: Prior CT abdomen/ pelvis 09/21/2011; prior abdominal
ultrasound 09/21/2011

CLINICAL DATA: Three day history of right upper quadrant and
epigastric pain.

EXAM:
ULTRASOUND ABDOMEN COMPLETE

[Series 1: us abdomen complete · 0.37mm/px · 13 of 95 slices shown]
[im 1/95]
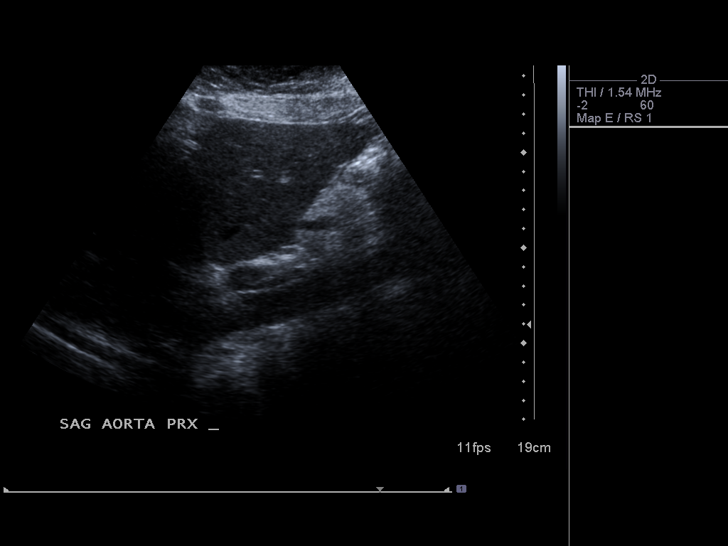
[im 8/95]
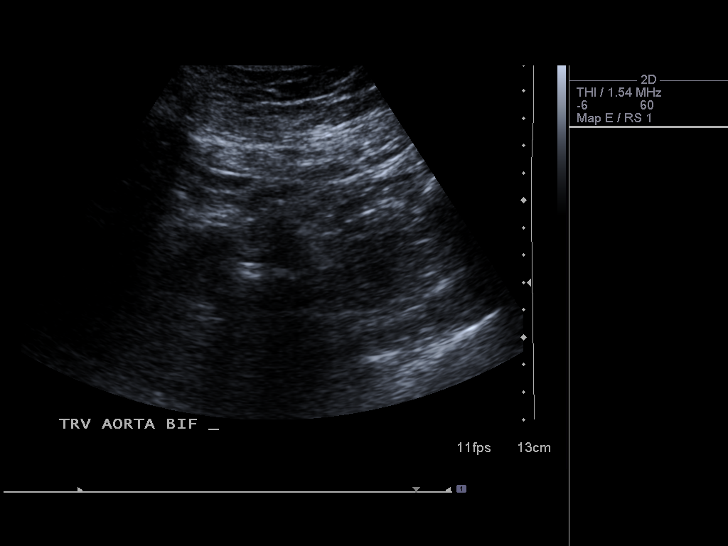
[im 16/95]
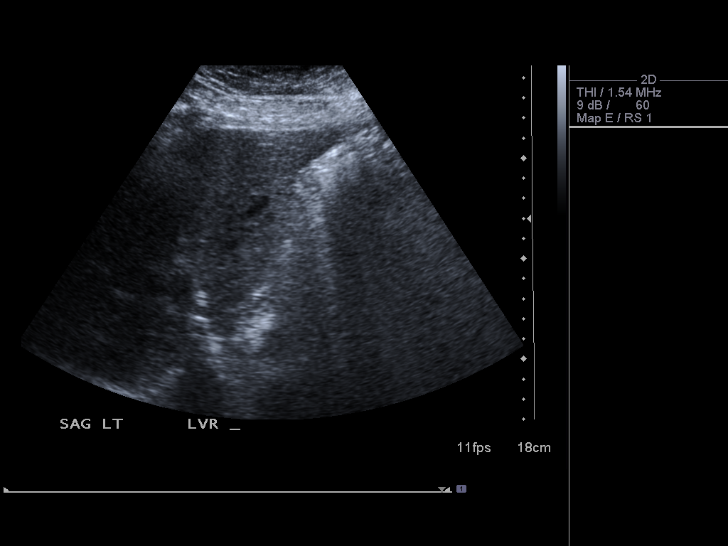
[im 24/95]
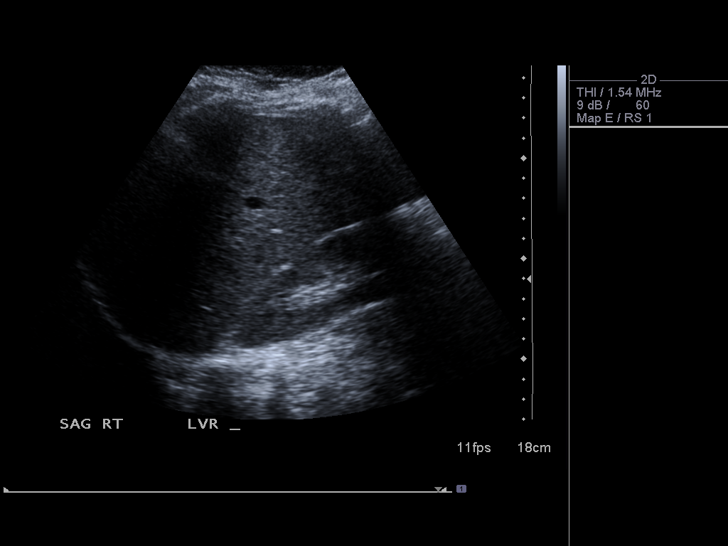
[im 32/95]
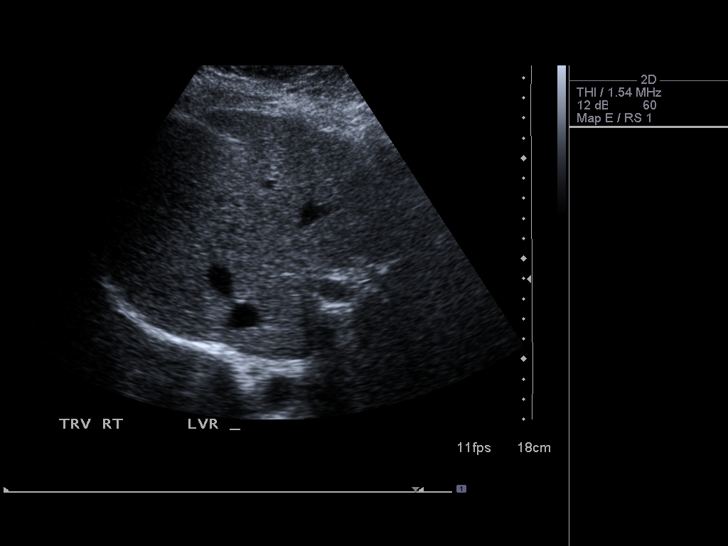
[im 40/95]
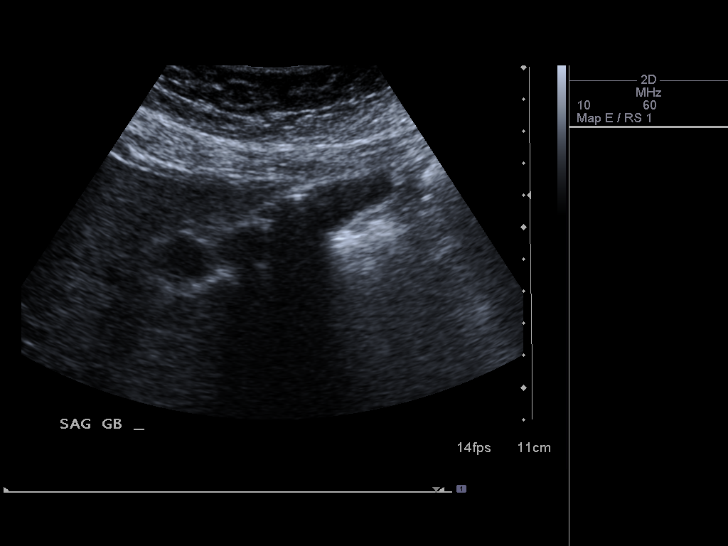
[im 48/95]
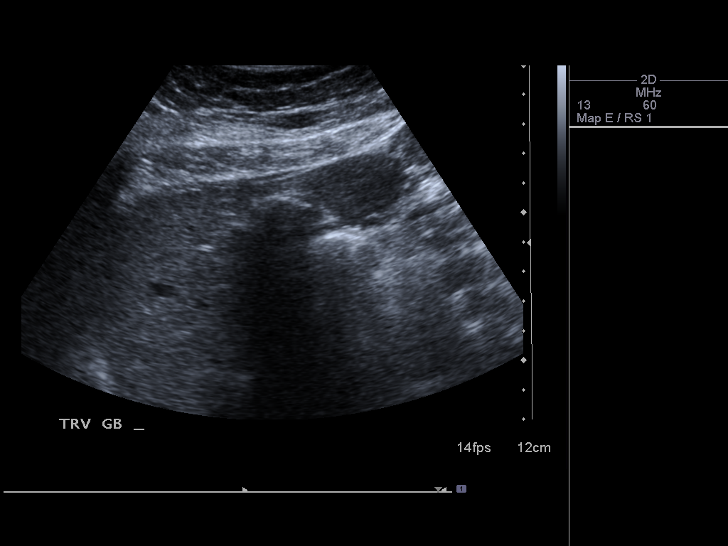
[im 55/95]
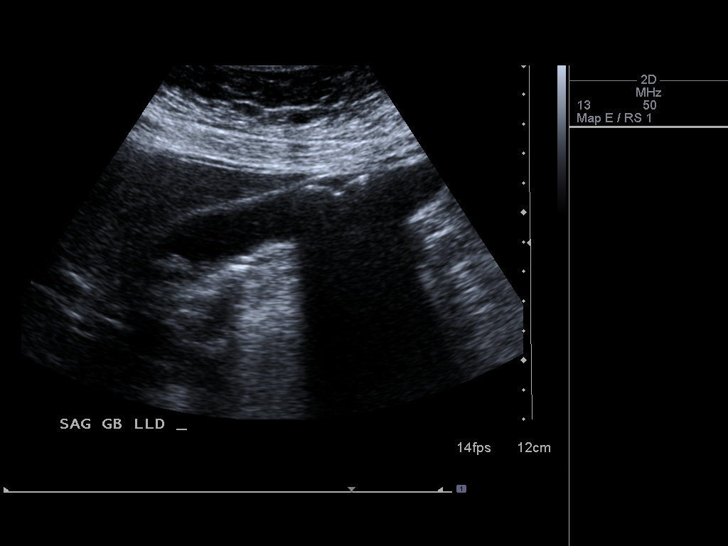
[im 63/95]
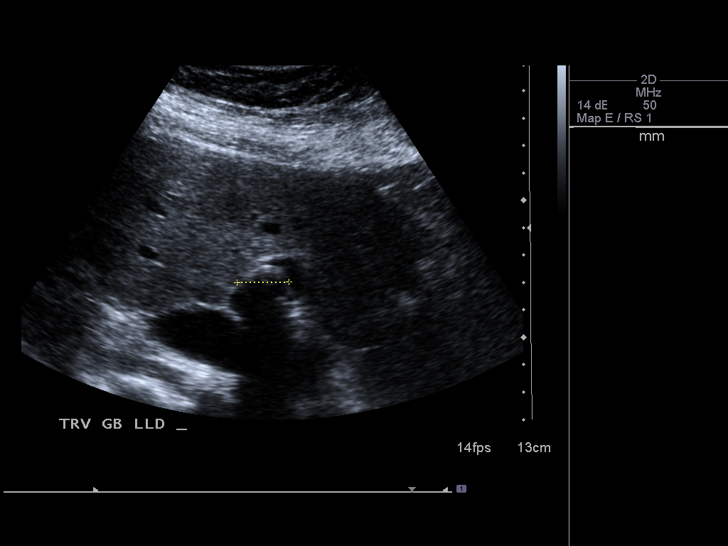
[im 71/95]
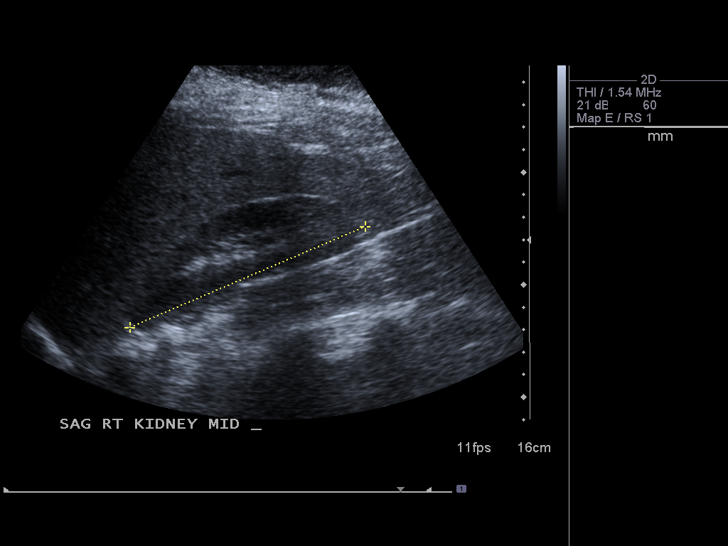
[im 79/95]
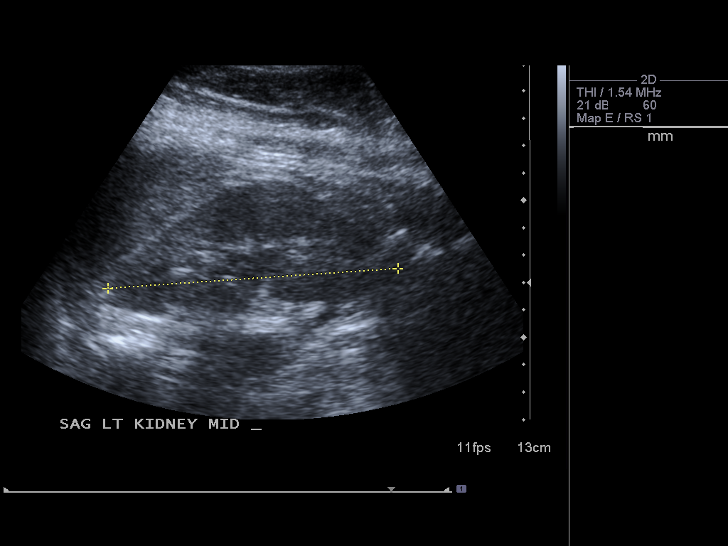
[im 87/95]
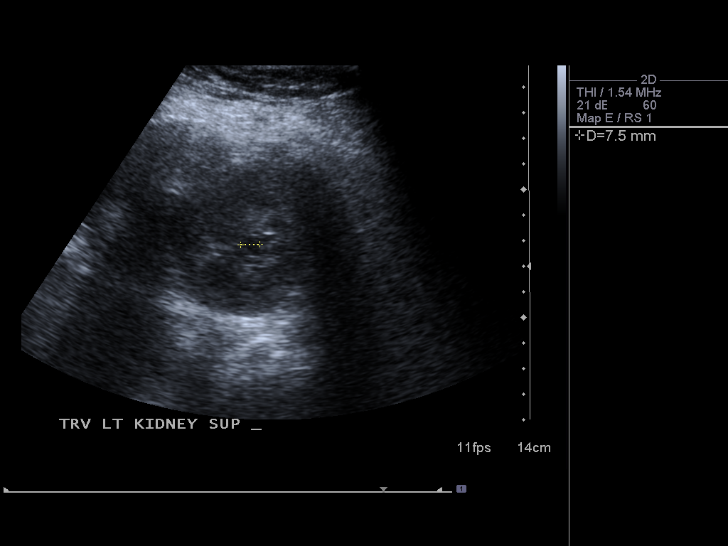
[im 95/95]
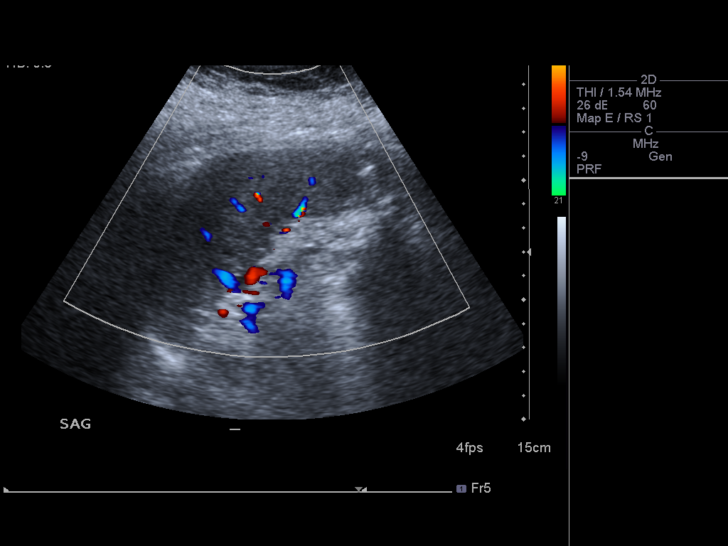

[13 of 25 positions shown; findings below may reference images not displayed]

FINDINGS: Gallbladder:

Multiple mobile echogenic and shadowing foci consistent with
cholelithiasis. The largest stone is identified at the gallbladder
neck and measures up to 2.4 cm. No pericholecystic fluid or
gallbladder wall thickening. Per the sonographer, the sonographic
Murphy sign was negative.

Common bile duct:

Diameter: Within normal limits at 2.9 mm

Liver:

No focal lesion identified. Normal parenchymal echogenicity. Main
portal vein is patent with normal hepatopetal flow.

IVC:

No abnormality visualized.

Pancreas:

Visualized portion unremarkable.

Spleen:

Size and appearance within normal limits.

Right Kidney:

Length: 11.4 cm. Echogenicity within normal limits. No mass or
hydronephrosis visualized.

Left Kidney:

Length: 10.6 cm. Echogenicity within normal limits. No mass or
hydronephrosis visualized. Small simple 9 mm cyst in the upper pole.

Abdominal aorta:

No aneurysm visualized.

Other findings:

None.
IMPRESSION: Cholelithiasis without sonographic evidence to suggest acute
cholecystitis.

## 2018-10-20 ENCOUNTER — Emergency Department (HOSPITAL_BASED_OUTPATIENT_CLINIC_OR_DEPARTMENT_OTHER)
Admission: EM | Admit: 2018-10-20 | Discharge: 2018-10-21 | Disposition: A | Discharge status: Home / Self Care | Payer: BLUE CROSS/BLUE SHIELD | Attending: Emergency Medicine | Admitting: Emergency Medicine

## 2018-10-20 ENCOUNTER — Emergency Department (HOSPITAL_BASED_OUTPATIENT_CLINIC_OR_DEPARTMENT_OTHER): Payer: BLUE CROSS/BLUE SHIELD

## 2018-10-20 ENCOUNTER — Other Ambulatory Visit: Payer: Self-pay

## 2018-10-20 ENCOUNTER — Encounter (HOSPITAL_BASED_OUTPATIENT_CLINIC_OR_DEPARTMENT_OTHER): Payer: Self-pay | Admitting: Emergency Medicine

## 2018-10-20 DIAGNOSIS — I880 Nonspecific mesenteric lymphadenitis: Secondary | ICD-10-CM | POA: Diagnosis not present

## 2018-10-20 DIAGNOSIS — Z79899 Other long term (current) drug therapy: Secondary | ICD-10-CM | POA: Insufficient documentation

## 2018-10-20 DIAGNOSIS — R112 Nausea with vomiting, unspecified: Secondary | ICD-10-CM | POA: Diagnosis not present

## 2018-10-20 DIAGNOSIS — Z87891 Personal history of nicotine dependence: Secondary | ICD-10-CM | POA: Insufficient documentation

## 2018-10-20 DIAGNOSIS — M549 Dorsalgia, unspecified: Secondary | ICD-10-CM | POA: Insufficient documentation

## 2018-10-20 DIAGNOSIS — R1033 Periumbilical pain: Secondary | ICD-10-CM | POA: Diagnosis present

## 2018-10-20 DIAGNOSIS — R109 Unspecified abdominal pain: Secondary | ICD-10-CM

## 2018-10-20 LAB — URINALYSIS, ROUTINE W REFLEX MICROSCOPIC
Bilirubin Urine: NEGATIVE
Glucose, UA: NEGATIVE mg/dL
Ketones, ur: NEGATIVE mg/dL
Leukocytes,Ua: NEGATIVE
Nitrite: NEGATIVE
Protein, ur: NEGATIVE mg/dL
Specific Gravity, Urine: 1.02 (ref 1.005–1.030)
pH: 6 (ref 5.0–8.0)

## 2018-10-20 LAB — COMPREHENSIVE METABOLIC PANEL
ALT: 14 U/L (ref 0–44)
AST: 17 U/L (ref 15–41)
Albumin: 3.6 g/dL (ref 3.5–5.0)
Alkaline Phosphatase: 39 U/L (ref 38–126)
Anion gap: 8 (ref 5–15)
BUN: 21 mg/dL — ABNORMAL HIGH (ref 6–20)
CO2: 26 mmol/L (ref 22–32)
Calcium: 8.9 mg/dL (ref 8.9–10.3)
Chloride: 101 mmol/L (ref 98–111)
Creatinine, Ser: 1.21 mg/dL — ABNORMAL HIGH (ref 0.44–1.00)
GFR calc Af Amer: >60 mL/min (ref >60)
GFR calc non Af Amer: 57 mL/min — ABNORMAL LOW (ref >60)
Glucose, Bld: 105 mg/dL — ABNORMAL HIGH (ref 70–99)
Potassium: 3.1 mmol/L — ABNORMAL LOW (ref 3.5–5.1)
Sodium: 135 mmol/L (ref 135–145)
Total Bilirubin: 0.4 mg/dL (ref 0.3–1.2)
Total Protein: 7.1 g/dL (ref 6.5–8.1)

## 2018-10-20 LAB — CBC
HCT: 39.1 % (ref 36.0–46.0)
Hemoglobin: 12.5 g/dL (ref 12.0–15.0)
MCH: 27.2 pg (ref 26.0–34.0)
MCHC: 32 g/dL (ref 30.0–36.0)
MCV: 85 fL (ref 80.0–100.0)
Platelets: 270 10*3/uL (ref 150–400)
RBC: 4.6 MIL/uL (ref 3.87–5.11)
RDW: 14.1 % (ref 11.5–15.5)
WBC: 8.4 10*3/uL (ref 4.0–10.5)
nRBC: 0 % (ref 0.0–0.2)

## 2018-10-20 LAB — URINALYSIS, MICROSCOPIC (REFLEX): WBC, UA: NONE SEEN WBC/hpf (ref 0–5)

## 2018-10-20 LAB — PREGNANCY, URINE: Preg Test, Ur: NEGATIVE

## 2018-10-20 LAB — LIPASE, BLOOD: Lipase: 28 U/L (ref 11–51)

## 2018-10-20 MED ORDER — SODIUM CHLORIDE 0.9 % IV BOLUS
1000.0000 mL | Freq: Once | INTRAVENOUS | Status: AC
  Administered 2018-10-20: 23:00:00 1000 mL via INTRAVENOUS

## 2018-10-20 MED ORDER — IOHEXOL 300 MG/ML  SOLN
125.0000 mL | Freq: Once | INTRAMUSCULAR | Status: AC | PRN
  Administered 2018-10-20: 125 mL via INTRAVENOUS

## 2018-10-20 MED ORDER — ONDANSETRON HCL 4 MG/2ML IJ SOLN
4.0000 mg | Freq: Once | INTRAMUSCULAR | Status: AC | PRN
  Administered 2018-10-20: 23:00:00 4 mg via INTRAVENOUS
  Filled 2018-10-20: qty 2

## 2018-10-20 NOTE — ED Notes (Signed)
ED Provider at bedside. 

## 2018-10-20 NOTE — ED Provider Notes (Signed)
[facility] EMERGENCY DEPARTMENT Provider Note   CSN: [id number] Arrival date & time: 10/20/18  2125    History   Chief Complaint Chief Complaint  Patient presents with  . Abdominal Pain    HPI Kendra Fowler is a 38 y.o. female.     38yo F w/ PMH including HTN, cholecystectomy who p/w abdominal pain and back pain. Last week, she started having periumbilical pain that was worse w/ bending and twisting. 3 days ago in the evening, she began having nausea along with the periumbilical pain. She woke up in the middle of the night with severe pain and belching with gas. She had 1 episode of vomiting this morning. No diarrhea but her stool was dark this morning. She notes that the pain feels like the same pain as she had before cholecystectomy. Pain is worse when walking around, not associated w/ eating. No alcohol use, no heavy NSAID use. She denies heartburn symptoms. No urinary or vaginal symptoms.   She notes that she has had intermittent low back pain without injury since last year. She has been evaluated previously for this and ruled out for GYN or urinary problem.   The history is provided by the patient.  Abdominal Pain    Past Medical History:  Diagnosis Date  . Gallbladder attack   . Ovarian cyst     Patient Active Problem List   Diagnosis Date Noted  . Symptomatic cholelithiasis 10/12/2012    Past Surgical History:  Procedure Laterality Date  . CHOLECYSTECTOMY       OB History   No obstetric history on file.      Home Medications    Prior to Admission medications   Medication Sig Start Date End Date Taking? Authorizing Provider  hydrochlorothiazide (HYDRODIURIL) 25 MG tablet TK 1 T PO QD 11/10/14  Yes [provider]  Norethindrone Acetate-Ethinyl Estrad-FE (LOESTRIN 24 FE) 1-20 MG-MCG(24) tablet Take by mouth. 01/27/18  Yes [provider]  HYDROcodone-acetaminophen (NORCO/VICODIN) 5-325 MG per tablet Take 1-2 tablets by  mouth every 6 (six) hours as needed for moderate pain. 09/29/13   Molpus, Jonny RuizJohn, MD  Multiple Vitamins-Minerals (MULTIVITAMINS THER. W/MINERALS) TABS Take 1 tablet by mouth daily.      [provider]  naproxen (NAPROSYN) 500 MG tablet Take 1 tablet (500 mg total) by mouth 2 (two) times daily. 08/18/13   Rolland PorterJames, Mark, MD  ondansetron (ZOFRAN ODT) 8 MG disintegrating tablet Take 1 tablet (8 mg total) by mouth every 8 (eight) hours as needed for nausea or vomiting. 09/29/13   Molpus, Jonny RuizJohn, MD    Family History Family History  Problem Relation Age of Onset  . Breast cancer Other        Aunt and Grandmother    Social History Social History   Tobacco Use  . Smoking status: Former Games developermoker  . Smokeless tobacco: Never Used  Substance Use Topics  . Alcohol use: No  . Drug use: No     Allergies   Penicillins and Lobster [shellfish allergy]   Review of Systems Review of Systems  Gastrointestinal: Positive for abdominal pain.   All other systems reviewed and are negative except that which was mentioned in HPI   Physical Exam Updated Vital Signs BP 130/87 (BP Location: Left Arm)   Pulse 80   Temp 98.4 F (36.9 C) (Oral)   Resp 18   Ht 5\' 7"  (1.702 m)   Wt 116.6 kg   LMP 07/22/2018   SpO2 100%  BMI 40.25 kg/m   Physical Exam Vitals signs and nursing note reviewed.  Constitutional:      General: She is not in acute distress.    Appearance: She is well-developed.  HENT:     Head: Normocephalic and atraumatic.     Mouth/Throat:     Mouth: Mucous membranes are moist.  Eyes:     Conjunctiva/sclera: Conjunctivae normal.  Neck:     Musculoskeletal: Neck supple.  Cardiovascular:     Rate and Rhythm: Normal rate and regular rhythm.     Heart sounds: Normal heart sounds. No murmur.  Pulmonary:     Effort: Pulmonary effort is normal.     Breath sounds: Normal breath sounds.  Abdominal:     General: Bowel sounds are normal. There is no distension.     Palpations:  Abdomen is soft.     Tenderness: There is abdominal tenderness in the right lower quadrant, epigastric area and periumbilical area. There is no guarding or rebound.  Skin:    General: Skin is warm and dry.  Neurological:     Mental Status: She is alert and oriented to person, place, and time.     Comments: Fluent speech  Psychiatric:        Judgment: Judgment normal.      ED Treatments / Results  Labs (all labs ordered are listed, but only abnormal results are displayed) Labs Reviewed  COMPREHENSIVE METABOLIC PANEL - Abnormal; Notable for the following components:      Result Value   Potassium 3.1 (*)    Glucose, Bld 105 (*)    BUN 21 (*)    Creatinine, Ser 1.21 (*)    GFR calc non Af Amer 57 (*)    All other components within normal limits  URINALYSIS, ROUTINE W REFLEX MICROSCOPIC - Abnormal; Notable for the following components:   Hgb urine dipstick TRACE (*)    All other components within normal limits  URINALYSIS, MICROSCOPIC (REFLEX) - Abnormal; Notable for the following components:   Bacteria, UA RARE (*)    All other components within normal limits  LIPASE, BLOOD  CBC  PREGNANCY, URINE    EKG None  Radiology No results found.  Procedures Procedures (including critical care time)  Medications Ordered in ED Medications  ondansetron (ZOFRAN) injection 4 mg (has no administration in time range)  sodium chloride 0.9 % bolus 1,000 mL (has no administration in time range)     Initial Impression / Assessment and Plan / ED Course  I have reviewed the triage vital signs and the nursing notes.  Pertinent labs & imaging results that were available during my care of the patient were reviewed by me and considered in my medical decision making (see chart for details).        Well appearing on exam, normal VS. tenderness in periumbilical abdomen and right lower quadrant.  Differential includes appendicitis, diverticulitis, peptic ulcer disease, gastroenteritis.   Because of her right lower quadrant tenderness, recommended CT to rule out appendicitis.  Lab work shows normal CBC, normal LFTs and lipase, creatinine 1.21 for which I have ordered IV fluid bolus.  Signing the patient out to the oncoming provider who will follow up on CT imaging.  If negative, anticipate that the patient may be able to go home with empiric treatment for PUD.  Final Clinical Impressions(s) / ED Diagnoses   Final diagnoses:  None    ED Discharge Orders    None       Sharonlee Nine,  Ambrose Finland, MD 10/20/18 [phone]

## 2018-10-20 NOTE — ED Triage Notes (Signed)
Patient presents with complaints of epigastricpain and nausea and belching and gas. States also co right flank to lower back pain. States vomiting x 1 this am; denies diarrhea states BM this am was dark black in color.

## 2018-10-21 MED ORDER — ONDANSETRON 8 MG PO TBDP: ORAL_TABLET | ORAL | 0 refills | Status: DC

## 2018-10-21 MED ORDER — TRAMADOL HCL 50 MG PO TABS: 50.0000 mg | ORAL_TABLET | Freq: Four times a day (QID) | ORAL | 0 refills | Status: DC | PRN

## 2018-10-21 NOTE — ED Provider Notes (Signed)
Care assumed from Dr. Clarene Duke at shift change.  Patient awaiting results of a CT scan to evaluate the cause of her right lower quadrant pain.  Patient has no fever and no white count, but due to the degree of pain CT scan was obtained.  The results are negative for appendicitis or other surgical intra-abdominal process.  There is the finding of mesenteric adenitis which I suspect is the cause of her discomfort.  Patient appears comfortable and nontoxic.  She will be prescribed medicine for her pain and nausea and is to follow-up with PCP/return as needed.   Geoffery Lyons, MD 10/21/18 [phone]

## 2018-10-21 NOTE — Discharge Instructions (Addendum)
Tramadol as prescribed as needed for pain.  Zofran as prescribed as needed for nausea.  Follow-up with your primary doctor if symptoms or not improving in the next 3 days, and return to the ER if you develop worsening pain, high fever, bloody stool, or other new and concerning symptoms.

## 2019-05-10 ENCOUNTER — Emergency Department (HOSPITAL_BASED_OUTPATIENT_CLINIC_OR_DEPARTMENT_OTHER)
Admission: EM | Admit: 2019-05-10 | Discharge: 2019-05-11 | Disposition: A | Discharge status: Home / Self Care | Payer: BLUE CROSS/BLUE SHIELD | Attending: Emergency Medicine | Admitting: Emergency Medicine

## 2019-05-10 ENCOUNTER — Encounter (HOSPITAL_BASED_OUTPATIENT_CLINIC_OR_DEPARTMENT_OTHER): Payer: Self-pay | Admitting: *Deleted

## 2019-05-10 ENCOUNTER — Other Ambulatory Visit: Payer: Self-pay

## 2019-05-10 DIAGNOSIS — Z79899 Other long term (current) drug therapy: Secondary | ICD-10-CM | POA: Insufficient documentation

## 2019-05-10 DIAGNOSIS — Z880 Allergy status to penicillin: Secondary | ICD-10-CM | POA: Diagnosis not present

## 2019-05-10 DIAGNOSIS — R519 Headache, unspecified: Secondary | ICD-10-CM | POA: Insufficient documentation

## 2019-05-10 DIAGNOSIS — Z91013 Allergy to seafood: Secondary | ICD-10-CM | POA: Insufficient documentation

## 2019-05-10 DIAGNOSIS — R05 Cough: Secondary | ICD-10-CM | POA: Insufficient documentation

## 2019-05-10 DIAGNOSIS — R0981 Nasal congestion: Secondary | ICD-10-CM | POA: Insufficient documentation

## 2019-05-10 DIAGNOSIS — M7918 Myalgia, other site: Secondary | ICD-10-CM | POA: Insufficient documentation

## 2019-05-10 DIAGNOSIS — Z20822 Contact with and (suspected) exposure to covid-19: Secondary | ICD-10-CM

## 2019-05-10 NOTE — ED Triage Notes (Addendum)
,  Pt c/o h/a, cough  and congestion x 3 days, expose to covid at work, covid test done 2 days ago no results,

## 2019-05-11 LAB — PREGNANCY, URINE: Preg Test, Ur: NEGATIVE

## 2019-05-11 MED ORDER — KETOROLAC TROMETHAMINE 15 MG/ML IJ SOLN
15.0000 mg | Freq: Once | INTRAMUSCULAR | Status: DC
  Filled 2019-05-11: qty 1

## 2019-05-11 MED ORDER — SODIUM CHLORIDE 0.9 % IV BOLUS
1000.0000 mL | Freq: Once | INTRAVENOUS | Status: AC
  Administered 2019-05-11: 1000 mL via INTRAVENOUS

## 2019-05-11 MED ORDER — KETOROLAC TROMETHAMINE 15 MG/ML IJ SOLN
15.0000 mg | Freq: Once | INTRAMUSCULAR | Status: AC
  Administered 2019-05-11: 15 mg via INTRAVENOUS

## 2019-05-11 MED ORDER — METOCLOPRAMIDE HCL 5 MG/ML IJ SOLN
10.0000 mg | Freq: Once | INTRAMUSCULAR | Status: AC
  Administered 2019-05-11: 01:00:00 10 mg via INTRAVENOUS
  Filled 2019-05-11: qty 2

## 2019-05-11 MED ORDER — DIPHENHYDRAMINE HCL 50 MG/ML IJ SOLN
25.0000 mg | Freq: Once | INTRAMUSCULAR | Status: AC
  Administered 2019-05-11: 25 mg via INTRAVENOUS
  Filled 2019-05-11: qty 1

## 2019-05-11 NOTE — ED Provider Notes (Addendum)
[facility] Provider Note: Kendra Dell, MD, FACEP  CSN: [id number] MRN: [id number] ARRIVAL: 05/10/19 at 2304 ROOM: [facility]04/[facility]   CHIEF COMPLAINT  Headache   HISTORY OF PRESENT ILLNESS  05/11/19 12:24 AM Kendra Fowler is a 38 y.o. female who has been exposed to Covid and had a Covid test two days ago which has not yet resulted.  She has had 3 days of nasal congestion, mild cough, mild body aches and malaise.  She had a low-grade fever earlier in the week but none now.  She is here with a headache which she describes as a "migraine" like headache she has had in the remote past.  She rates her pain as a 10 out of 10 and describes it as generalized.   Past Medical History:  Diagnosis Date  . Gallbladder attack   . Ovarian cyst     Past Surgical History:  Procedure Laterality Date  . CHOLECYSTECTOMY      Family History  Problem Relation Age of Onset  . Breast cancer Other        Aunt and Grandmother    Social History   Tobacco Use  . Smoking status: Former Games developer  . Smokeless tobacco: Never Used  Substance Use Topics  . Alcohol use: No  . Drug use: No    Prior to Admission medications   Medication Sig Start Date End Date Taking? Authorizing Provider  aspirin-acetaminophen-caffeine (EXCEDRIN MIGRAINE) [phone] MG tablet Take by mouth every 6 (six) hours as needed for headache.   Yes [provider]  clindamycin (CLEOCIN) 300 MG capsule Take 300 mg by mouth 3 (three) times daily. 04/22/19   [provider]  hydrochlorothiazide (HYDRODIURIL) 25 MG tablet TK 1 T PO QD 11/10/14   [provider]  Multiple Vitamins-Minerals (MULTIVITAMINS THER. W/MINERALS) TABS Take 1 tablet by mouth daily.      [provider]  Norethindrone Acetate-Ethinyl Estrad-FE (LOESTRIN 24 FE) 1-20 MG-MCG(24) tablet Take by mouth. 01/27/18   [provider]  traMADol (ULTRAM) 50 MG tablet Take 1 tablet (50 mg total) by mouth every 6 (six)  hours as needed. 10/21/18   Geoffery Lyons, MD    Allergies Penicillins and Lobster [shellfish allergy]   REVIEW OF SYSTEMS  Negative except as noted here or in the History of Present Illness.   PHYSICAL EXAMINATION  Initial Vital Signs Blood pressure 140/88, pulse 73, temperature 98.8 F (37.1 C), resp. rate 16, height 5' 7.5" (1.715 m), weight 127 kg, SpO2 100 %.  Examination General: Well-developed, well-nourished female in no acute distress; appearance consistent with age of record HENT: normocephalic; atraumatic Eyes: pupils equal, round and reactive to light; extraocular muscles intact Neck: supple Heart: regular rate and rhythm Lungs: clear to auscultation bilaterally Abdomen: soft; nondistended; nontender; bowel sounds present Extremities: No deformity; full range of motion Neurologic: Awake, alert and oriented; motor function intact in all extremities and symmetric; no facial droop Skin: Warm and dry Psychiatric: Normal mood and affect   RESULTS  Summary of this visit's results, reviewed and interpreted by myself:   EKG Interpretation  Date/Time:    Ventricular Rate:    PR Interval:    QRS Duration:   QT Interval:    QTC Calculation:   R Axis:     Text Interpretation:        Laboratory Studies: Results for orders placed or performed during the hospital encounter of 05/10/19 (from the past 24 hour(s))  Pregnancy, urine  Status: None   Collection Time: 05/11/19  1:55 AM  Result Value Ref Range   Preg Test, Ur NEGATIVE NEGATIVE   Imaging Studies: No results found.  ED COURSE and MDM  Nursing notes, initial and subsequent vitals signs, including pulse oximetry, reviewed and interpreted by myself.  Vitals:   05/10/19 2318 05/10/19 2320  BP:  140/88  Pulse:  73  Resp:  16  Temp:  98.8 F (37.1 C)  SpO2:  100%  Weight: 127 kg   Height: 5' 7.5" (1.715 m)    Medications  ketorolac (TORADOL) 15 MG/ML injection 15 mg (has no administration in time  range)  sodium chloride 0.9 % bolus 1,000 mL (0 mLs Intravenous Stopped 05/11/19 0201)  diphenhydrAMINE (BENADRYL) injection 25 mg (25 mg Intravenous Given 05/11/19 0052)  metoCLOPramide (REGLAN) injection 10 mg (10 mg Intravenous Given 05/11/19 0050)   2:01 AM Patient feeling better after IV fluids and medications.  She states she is ready to go home.  [name] was evaluated in Emergency Department on 05/11/2019 for the symptoms described in the history of present illness. She was evaluated in the context of the global COVID-19 pandemic, which necessitated consideration that the patient might be at risk for infection with the SARS-CoV-2 virus that causes COVID-19. Institutional protocols and algorithms that pertain to the evaluation of patients at risk for COVID-19 are in a state of rapid change based on information released by regulatory bodies including the CDC and federal and state organizations. These policies and algorithms were followed during the patient's care in the ED.   PROCEDURES  Procedures   ED DIAGNOSES     ICD-10-CM   1. Bad headache  R51.9   2. Suspected COVID-19 virus infection  Z20.828        Shanon Rosser, MD 05/11/19 0202    Shanon Rosser, MD 05/11/19 0206    Shanon Rosser, MD 05/11/19 6063

## 2019-05-11 NOTE — ED Notes (Signed)
Notified pt to provide urine sample.

## 2020-01-21 ENCOUNTER — Other Ambulatory Visit: Payer: Self-pay

## 2020-01-21 ENCOUNTER — Encounter (HOSPITAL_BASED_OUTPATIENT_CLINIC_OR_DEPARTMENT_OTHER): Payer: Self-pay

## 2020-01-21 DIAGNOSIS — R5383 Other fatigue: Secondary | ICD-10-CM | POA: Insufficient documentation

## 2020-01-21 DIAGNOSIS — U071 COVID-19: Secondary | ICD-10-CM | POA: Diagnosis not present

## 2020-01-21 DIAGNOSIS — Z87891 Personal history of nicotine dependence: Secondary | ICD-10-CM | POA: Insufficient documentation

## 2020-01-21 DIAGNOSIS — Z7982 Long term (current) use of aspirin: Secondary | ICD-10-CM | POA: Diagnosis not present

## 2020-01-21 DIAGNOSIS — M791 Myalgia, unspecified site: Secondary | ICD-10-CM | POA: Diagnosis present

## 2020-01-21 LAB — SARS CORONAVIRUS 2 BY RT PCR (HOSPITAL ORDER, PERFORMED IN ~~LOC~~ HOSPITAL LAB): SARS Coronavirus 2: POSITIVE — AB

## 2020-01-21 MED ORDER — ACETAMINOPHEN 325 MG PO TABS
650.0000 mg | ORAL_TABLET | Freq: Once | ORAL | Status: AC
  Administered 2020-01-21: 650 mg via ORAL
  Filled 2020-01-21: qty 2

## 2020-01-21 NOTE — ED Triage Notes (Signed)
Pt c/o body aches, fever, URI sx, v/d-NAD x today-steady gait

## 2020-01-22 ENCOUNTER — Emergency Department (HOSPITAL_BASED_OUTPATIENT_CLINIC_OR_DEPARTMENT_OTHER)
Admission: EM | Admit: 2020-01-22 | Discharge: 2020-01-22 | Disposition: A | Discharge status: Home / Self Care | Payer: BLUE CROSS/BLUE SHIELD | Attending: Emergency Medicine | Admitting: Emergency Medicine

## 2020-01-22 ENCOUNTER — Other Ambulatory Visit: Payer: Self-pay | Admitting: Oncology

## 2020-01-22 ENCOUNTER — Telehealth: Payer: Self-pay | Admitting: Oncology

## 2020-01-22 DIAGNOSIS — U071 COVID-19: Secondary | ICD-10-CM

## 2020-01-22 MED ORDER — ALBUTEROL SULFATE HFA 108 (90 BASE) MCG/ACT IN AERS: 1.0000 | INHALATION_SPRAY | Freq: Four times a day (QID) | RESPIRATORY_TRACT | 0 refills | Status: DC | PRN

## 2020-01-22 MED ORDER — IBUPROFEN 400 MG PO TABS: 600.0000 mg | ORAL_TABLET | Freq: Once | ORAL | Status: DC

## 2020-01-22 MED ORDER — ONDANSETRON 4 MG PO TBDP: 4.0000 mg | ORAL_TABLET | Freq: Three times a day (TID) | ORAL | 0 refills | Status: AC | PRN

## 2020-01-22 MED ORDER — PREDNISONE 10 MG PO TABS: 20.0000 mg | ORAL_TABLET | Freq: Every day | ORAL | 0 refills | Status: AC

## 2020-01-22 MED ORDER — IBUPROFEN 400 MG PO TABS
600.0000 mg | ORAL_TABLET | Freq: Once | ORAL | Status: AC
  Administered 2020-01-22: 600 mg via ORAL
  Filled 2020-01-22: qty 1

## 2020-01-22 NOTE — Telephone Encounter (Signed)
I connected by phone with Mrs. Blais on 01/22/20 at 12:45pm to discuss the potential use of an new treatment for mild to moderate COVID-19 viral infection in non-hospitalized patients.   This patient is a age/sex that meets the FDA criteria for Emergency Use Authorization of casirivimab\imdevimab.  Has a (+) direct SARS-CoV-2 viral test result 1. Has mild or moderate COVID-19  2. Is ? 38 years of age and weighs ? 40 kg 3. Is NOT hospitalized due to COVID-19 4. Is NOT requiring oxygen therapy or requiring an increase in baseline oxygen flow rate due to COVID-19 5. Is within 10 days of symptom onset 6. Has at least one of the high risk factor(s) for progression to severe COVID-19 and/or hospitalization as defined in EUA. ? Specific high risk criteria : BMI > 45   Symptom onset  01/21/20.   I have spoken and communicated the following to the patient or parent/caregiver:   1. FDA has authorized the emergency use of casirivimab\imdevimab for the treatment of mild to moderate COVID-19 in adults and pediatric patients with positive results of direct SARS-CoV-2 viral testing who are 68 years of age and older weighing at least 40 kg, and who are at high risk for progressing to severe COVID-19 and/or hospitalization.   2. The significant known and potential risks and benefits of casirivimab\imdevimab, and the extent to which such potential risks and benefits are unknown.   3. Information on available alternative treatments and the risks and benefits of those alternatives, including clinical trials.   4. Patients treated with casirivimab\imdevimab should continue to self-isolate and use infection control measures (e.g., wear mask, isolate, social distance, avoid sharing personal items, clean and disinfect "high touch" surfaces, and frequent handwashing) according to CDC guidelines.    5. The patient or parent/caregiver has the option to accept or refuse casirivimab\imdevimab .   After reviewing  this information with the patient, The patient agreed to proceed with receiving casirivimab\imdevimab infusion and will be provided a copy of the Fact sheet prior to receiving the infusion.Mignon Pine, AGNP-C [phone] (Infusion Center Hotline)

## 2020-01-22 NOTE — ED Provider Notes (Signed)
[facility] EMERGENCY DEPARTMENT Provider Note  CSN: [id number] Arrival date & time: 01/21/20 2212  Chief Complaint(s) Generalized Body Aches  HPI Kendra Fowler is a 39 y.o. female   The history is provided by the patient.  Influenza Presenting symptoms: cough, fatigue, fever, headache, myalgias, nausea and rhinorrhea   Severity:  Moderate Onset quality:  Gradual Duration:  2 days Progression:  Worsening Chronicity:  New Relieved by:  Nothing Worsened by:  Nothing Associated symptoms: chills, decreased appetite and nasal congestion   Associated symptoms: no neck stiffness   Risk factors: sick contacts (coworker with COVID)     Past Medical History Past Medical History:  Diagnosis Date  . Gallbladder attack   . Ovarian cyst    Patient Active Problem List   Diagnosis Date Noted  . Symptomatic cholelithiasis 10/12/2012   Home Medication(s) Prior to Admission medications   Medication Sig Start Date End Date Taking? Authorizing Provider  albuterol (VENTOLIN HFA) 108 (90 Base) MCG/ACT inhaler Inhale 1-2 puffs into the lungs every 6 (six) hours as needed for wheezing or shortness of breath. 01/22/20   Nira Conn, MD  aspirin-acetaminophen-caffeine (EXCEDRIN MIGRAINE) [phone] MG tablet Take by mouth every 6 (six) hours as needed for headache.    [provider]  clindamycin (CLEOCIN) 300 MG capsule Take 300 mg by mouth 3 (three) times daily. 04/22/19   [provider]  hydrochlorothiazide (HYDRODIURIL) 25 MG tablet TK 1 T PO QD 11/10/14   [provider]  Multiple Vitamins-Minerals (MULTIVITAMINS THER. W/MINERALS) TABS Take 1 tablet by mouth daily.      [provider]  Norethindrone Acetate-Ethinyl Estrad-FE (LOESTRIN 24 FE) 1-20 MG-MCG(24) tablet Take by mouth. 01/27/18   [provider]  ondansetron (ZOFRAN ODT) 4 MG disintegrating tablet Take 1 tablet (4 mg total) by mouth every 8 (eight) hours as needed  for up to 3 days for nausea or vomiting. 01/22/20 01/25/20  Autry Droege, Amadeo Garnet, MD  predniSONE (DELTASONE) 10 MG tablet Take 2 tablets (20 mg total) by mouth daily for 5 days. 01/22/20 01/27/20  Nira Conn, MD  traMADol (ULTRAM) 50 MG tablet Take 1 tablet (50 mg total) by mouth every 6 (six) hours as needed. 10/21/18   Geoffery Lyons, MD                                                                                                                                    Past Surgical History Past Surgical History:  Procedure Laterality Date  . CHOLECYSTECTOMY     Family History Family History  Problem Relation Age of Onset  . Breast cancer Other        Aunt and Grandmother    Social History Social History   Tobacco Use  . Smoking status: Former Games developer  . Smokeless tobacco: Never Used  Vaping Use  . Vaping Use: Never used  Substance Use Topics  . Alcohol  use: No  . Drug use: No   Allergies Penicillins and Lobster [shellfish allergy]  Review of Systems Review of Systems  Constitutional: Positive for chills, decreased appetite, fatigue and fever.  HENT: Positive for congestion and rhinorrhea.   Respiratory: Positive for cough.   Gastrointestinal: Positive for nausea.  Musculoskeletal: Positive for myalgias. Negative for neck stiffness.  Neurological: Positive for headaches.   All other systems are reviewed and are negative for acute change except as noted in the HPI  Physical Exam Vital Signs  I have reviewed the triage vital signs BP 119/77 (BP Location: Right Arm)   Pulse 90   Temp (!) 101.3 F (38.5 C) (Oral)   Resp 14   Ht 5\' 7"  (1.702 m)   Wt 134.3 kg   SpO2 99%   BMI 46.36 kg/m   Physical Exam Vitals reviewed.  Constitutional:      General: She is not in acute distress.    Appearance: She is well-developed. She is obese. She is not diaphoretic.  HENT:     Head: Normocephalic and atraumatic.     Right Ear: External ear normal.     Left Ear:  External ear normal.     Nose: Nose normal.  Eyes:     General: No scleral icterus.    Conjunctiva/sclera: Conjunctivae normal.  Neck:     Trachea: Phonation normal.  Cardiovascular:     Rate and Rhythm: Normal rate and regular rhythm.  Pulmonary:     Effort: Pulmonary effort is normal. No tachypnea, accessory muscle usage or respiratory distress.     Breath sounds: No stridor.  Abdominal:     General: There is no distension.  Musculoskeletal:        General: Normal range of motion.     Cervical back: Normal range of motion.  Neurological:     Mental Status: She is alert and oriented to person, place, and time.  Psychiatric:        Behavior: Behavior normal.     ED Results and Treatments Labs (all labs ordered are listed, but only abnormal results are displayed) Labs Reviewed  SARS CORONAVIRUS 2 BY RT PCR (HOSPITAL ORDER, PERFORMED IN [facility]) - Abnormal; Notable for the following components:      Result Value   SARS Coronavirus 2 POSITIVE (*)    All other components within normal limits                                                                                                                         EKG  EKG Interpretation  Date/Time:    Ventricular Rate:    PR Interval:    QRS Duration:   QT Interval:    QTC Calculation:   R Axis:     Text Interpretation:        Radiology No results found.  Pertinent labs & imaging results that were available during my care of the patient were reviewed by me and  considered in my medical decision making (see chart for details).  Medications Ordered in ED Medications  ibuprofen (ADVIL) tablet 600 mg (has no administration in time range)  acetaminophen (TYLENOL) tablet 650 mg (650 mg Oral Given 01/21/20 2238)  ibuprofen (ADVIL) tablet 600 mg (600 mg Oral Given 01/22/20 0545)                                                                                                                                     Procedures Procedures  (including critical care time)  Medical Decision Making / ED Course I have reviewed the nursing notes for this encounter and the patient's prior records (if available in EHR or on provided paperwork).   Rinaldo RatelShellie D Tuft was evaluated in Emergency Department on 01/22/2020 for the symptoms described in the history of present illness. She was evaluated in the context of the global COVID-19 pandemic, which necessitated consideration that the patient might be at risk for infection with the SARS-CoV-2 virus that causes COVID-19. Institutional protocols and algorithms that pertain to the evaluation of patients at risk for COVID-19 are in a state of rapid change based on information released by regulatory bodies including the CDC and federal and state organizations. These policies and algorithms were followed during the patient's care in the ED.  Patient presents with viral symptoms for 2 days. adequate oral hydration. Rest of history as above.  Patient appears well. No signs of toxicity, patient is interactive. No hypoxia, tachypnea or other signs of respiratory distress. No sign of clinical dehydration. Rest of exam as above.  COVID +. Appropriate for OP management. Will arrange for MAb infusion.  Patient evaluated, tested and sent home with instructions for home care and Quarantine. Instructed to seek further care if symptoms worsen.  Is set up with follow up for a virtual visit with PCP in next 24-48 hours.   Discussed symptomatic treatment with the patient and they will follow closely with their PCP.        Final Clinical Impression(s) / ED Diagnoses Final diagnoses:  COVID-19 virus infection    The patient appears reasonably screened and/or stabilized for discharge and I doubt any other medical condition or other [facility] requiring further screening, evaluation, or treatment in the ED at this time prior to discharge. Safe for discharge with strict return  precautions.  Disposition: Discharge  Condition: Good  I have discussed the results, Dx and Tx plan with the patient/family who expressed understanding and agree(s) with the plan. Discharge instructions discussed at length. The patient/family was given strict return precautions who verbalized understanding of the instructions. No further questions at time of discharge.    ED Discharge Orders         Ordered    ondansetron (ZOFRAN ODT) 4 MG disintegrating tablet  Every 8 hours PRN     Discontinue  Reprint     01/22/20 0613    predniSONE (DELTASONE)  10 MG tablet  Daily     Discontinue  Reprint     01/22/20 0613    albuterol (VENTOLIN HFA) 108 (90 Base) MCG/ACT inhaler  Every 6 hours PRN     Discontinue  Reprint     01/22/20 9983            Follow Up: Primary care provider  Call  As needed     This chart was dictated using voice recognition software.  Despite best efforts to proofread,  errors can occur which can change the documentation meaning.   Nira Conn, MD 01/22/20 [phone]

## 2020-01-22 NOTE — ED Notes (Signed)
RN Sharyl Nimrod informed of pt fever

## 2020-01-23 ENCOUNTER — Ambulatory Visit (HOSPITAL_COMMUNITY): Payer: BLUE CROSS/BLUE SHIELD

## 2020-01-24 ENCOUNTER — Ambulatory Visit (HOSPITAL_COMMUNITY)
Admission: RE | Admit: 2020-01-24 | Discharge: 2020-01-24 | Disposition: A | Discharge status: Home / Self Care | Payer: BLUE CROSS/BLUE SHIELD | Source: Ambulatory Visit | Attending: Pulmonary Disease | Admitting: Pulmonary Disease

## 2020-01-24 DIAGNOSIS — U071 COVID-19: Secondary | ICD-10-CM | POA: Insufficient documentation

## 2020-01-24 MED ORDER — ALBUTEROL SULFATE HFA 108 (90 BASE) MCG/ACT IN AERS: 2.0000 | INHALATION_SPRAY | Freq: Once | RESPIRATORY_TRACT | Status: DC | PRN

## 2020-01-24 MED ORDER — EPINEPHRINE 0.3 MG/0.3ML IJ SOAJ: 0.3000 mg | Freq: Once | INTRAMUSCULAR | Status: DC | PRN

## 2020-01-24 MED ORDER — FAMOTIDINE IN NACL 20-0.9 MG/50ML-% IV SOLN: 20.0000 mg | Freq: Once | INTRAVENOUS | Status: DC | PRN

## 2020-01-24 MED ORDER — METHYLPREDNISOLONE SODIUM SUCC 125 MG IJ SOLR: 125.0000 mg | Freq: Once | INTRAMUSCULAR | Status: DC | PRN

## 2020-01-24 MED ORDER — DIPHENHYDRAMINE HCL 50 MG/ML IJ SOLN: 50.0000 mg | Freq: Once | INTRAMUSCULAR | Status: DC | PRN

## 2020-01-24 MED ORDER — SODIUM CHLORIDE 0.9 % IV SOLN: INTRAVENOUS | Status: DC | PRN

## 2020-01-24 MED ORDER — SODIUM CHLORIDE 0.9 % IV SOLN
1200.0000 mg | Freq: Once | INTRAVENOUS | Status: AC
  Administered 2020-01-24: 1200 mg via INTRAVENOUS
  Filled 2020-01-24: qty 10

## 2020-01-24 NOTE — Discharge Instructions (Signed)

## 2020-01-24 NOTE — Progress Notes (Signed)
  Diagnosis: COVID-19  Physician:Dr wright  Procedure: [facility] Med: casirivimab\imdevimab infusion - Provided patient with casirivimab\imdevimab fact sheet for patients, parents and caregivers prior to infusion.  Complications: No immediate complications noted.  Discharge: Discharged home   Kendra Fowler 01/24/2020  

## 2020-02-17 ENCOUNTER — Emergency Department (HOSPITAL_BASED_OUTPATIENT_CLINIC_OR_DEPARTMENT_OTHER): Payer: BLUE CROSS/BLUE SHIELD

## 2020-02-17 ENCOUNTER — Encounter (HOSPITAL_BASED_OUTPATIENT_CLINIC_OR_DEPARTMENT_OTHER): Payer: Self-pay | Admitting: *Deleted

## 2020-02-17 ENCOUNTER — Other Ambulatory Visit: Payer: Self-pay

## 2020-02-17 ENCOUNTER — Emergency Department (HOSPITAL_BASED_OUTPATIENT_CLINIC_OR_DEPARTMENT_OTHER)
Admission: EM | Admit: 2020-02-17 | Discharge: 2020-02-17 | Disposition: A | Discharge status: Home / Self Care | Payer: BLUE CROSS/BLUE SHIELD | Attending: Emergency Medicine | Admitting: Emergency Medicine

## 2020-02-17 DIAGNOSIS — R109 Unspecified abdominal pain: Secondary | ICD-10-CM | POA: Diagnosis present

## 2020-02-17 DIAGNOSIS — Z79899 Other long term (current) drug therapy: Secondary | ICD-10-CM | POA: Diagnosis not present

## 2020-02-17 DIAGNOSIS — R1033 Periumbilical pain: Secondary | ICD-10-CM | POA: Insufficient documentation

## 2020-02-17 DIAGNOSIS — Z87891 Personal history of nicotine dependence: Secondary | ICD-10-CM | POA: Insufficient documentation

## 2020-02-17 DIAGNOSIS — Z7982 Long term (current) use of aspirin: Secondary | ICD-10-CM | POA: Insufficient documentation

## 2020-02-17 LAB — CBC WITH DIFFERENTIAL/PLATELET
Abs Immature Granulocytes: 0.02 10*3/uL (ref 0.00–0.07)
Basophils Absolute: 0 10*3/uL (ref 0.0–0.1)
Basophils Relative: 0 %
Eosinophils Absolute: 0.1 10*3/uL (ref 0.0–0.5)
Eosinophils Relative: 1 %
HCT: 39.6 % (ref 36.0–46.0)
Hemoglobin: 12.7 g/dL (ref 12.0–15.0)
Immature Granulocytes: 0 %
Lymphocytes Relative: 33 %
Lymphs Abs: 2.5 10*3/uL (ref 0.7–4.0)
MCH: 26.9 pg (ref 26.0–34.0)
MCHC: 32.1 g/dL (ref 30.0–36.0)
MCV: 83.9 fL (ref 80.0–100.0)
Monocytes Absolute: 0.5 10*3/uL (ref 0.1–1.0)
Monocytes Relative: 7 %
Neutro Abs: 4.5 10*3/uL (ref 1.7–7.7)
Neutrophils Relative %: 59 %
Platelets: 252 10*3/uL (ref 150–400)
RBC: 4.72 MIL/uL (ref 3.87–5.11)
RDW: 14.4 % (ref 11.5–15.5)
WBC: 7.5 10*3/uL (ref 4.0–10.5)
nRBC: 0 % (ref 0.0–0.2)

## 2020-02-17 LAB — COMPREHENSIVE METABOLIC PANEL
ALT: 27 U/L (ref 0–44)
AST: 26 U/L (ref 15–41)
Albumin: 3.5 g/dL (ref 3.5–5.0)
Alkaline Phosphatase: 42 U/L (ref 38–126)
Anion gap: 10 (ref 5–15)
BUN: 16 mg/dL (ref 6–20)
CO2: 25 mmol/L (ref 22–32)
Calcium: 9.4 mg/dL (ref 8.9–10.3)
Chloride: 102 mmol/L (ref 98–111)
Creatinine, Ser: 1.01 mg/dL — ABNORMAL HIGH (ref 0.44–1.00)
GFR calc Af Amer: >60 mL/min (ref >60)
GFR calc non Af Amer: >60 mL/min (ref >60)
Glucose, Bld: 99 mg/dL (ref 70–99)
Potassium: 3.3 mmol/L — ABNORMAL LOW (ref 3.5–5.1)
Sodium: 137 mmol/L (ref 135–145)
Total Bilirubin: 0.5 mg/dL (ref 0.3–1.2)
Total Protein: 7.1 g/dL (ref 6.5–8.1)

## 2020-02-17 LAB — URINALYSIS, ROUTINE W REFLEX MICROSCOPIC
Bilirubin Urine: NEGATIVE
Glucose, UA: NEGATIVE mg/dL
Ketones, ur: NEGATIVE mg/dL
Leukocytes,Ua: NEGATIVE
Nitrite: NEGATIVE
Protein, ur: NEGATIVE mg/dL
Specific Gravity, Urine: 1.02 (ref 1.005–1.030)
pH: 5.5 (ref 5.0–8.0)

## 2020-02-17 LAB — URINALYSIS, MICROSCOPIC (REFLEX)

## 2020-02-17 LAB — PREGNANCY, URINE: Preg Test, Ur: NEGATIVE

## 2020-02-17 LAB — LIPASE, BLOOD: Lipase: 25 U/L (ref 11–51)

## 2020-02-17 MED ORDER — IOHEXOL 300 MG/ML  SOLN
100.0000 mL | Freq: Once | INTRAMUSCULAR | Status: AC | PRN
  Administered 2020-02-17: 100 mL via INTRAVENOUS

## 2020-02-17 MED ORDER — ONDANSETRON HCL 4 MG/2ML IJ SOLN
4.0000 mg | Freq: Once | INTRAMUSCULAR | Status: AC
  Administered 2020-02-17: 4 mg via INTRAVENOUS
  Filled 2020-02-17: qty 2

## 2020-02-17 MED ORDER — FENTANYL CITRATE (PF) 100 MCG/2ML IJ SOLN
50.0000 ug | Freq: Once | INTRAMUSCULAR | Status: AC
  Administered 2020-02-17: 50 ug via INTRAVENOUS
  Filled 2020-02-17: qty 2

## 2020-02-17 MED ORDER — ONDANSETRON 8 MG PO TBDP: 8.0000 mg | ORAL_TABLET | Freq: Three times a day (TID) | ORAL | 1 refills | Status: DC | PRN

## 2020-02-17 MED ORDER — PANTOPRAZOLE SODIUM 40 MG PO TBEC: 40.0000 mg | DELAYED_RELEASE_TABLET | Freq: Every day | ORAL | 0 refills | Status: DC

## 2020-02-17 MED ORDER — PANTOPRAZOLE SODIUM 40 MG IV SOLR
40.0000 mg | Freq: Once | INTRAVENOUS | Status: AC
  Administered 2020-02-17: 40 mg via INTRAVENOUS
  Filled 2020-02-17: qty 40

## 2020-02-17 NOTE — ED Provider Notes (Signed)
[facility] Provider Note: Kendra Dell, MD, FACEP  CSN: [id number] MRN: [id number] ARRIVAL: 02/17/20 at 0312 ROOM: [facility]01/[facility]   CHIEF COMPLAINT  Abdominal Pain   HISTORY OF PRESENT ILLNESS  02/17/20 4:41 AM Kendra Fowler is a 39 y.o. female with abdominal pain for about a week. The pain started in the right lower quadrant and was intermittent and sharp but has now moved to the periumbilical area. It is dull, constant and sharp. She rates it as an 8 out of 10. It is not significantly changed with movement. It is not better or worse with eating. She has had nausea but no vomiting. She denies fever or chills. She denies vaginal bleeding or discharge. She denies dysuria or hematuria. She has taken ibuprofen and Pepto-Bismol without relief.   Past Medical History:  Diagnosis Date  . Gallbladder attack   . Ovarian cyst     Past Surgical History:  Procedure Laterality Date  . CHOLECYSTECTOMY      Family History  Problem Relation Age of Onset  . Breast cancer Other        Aunt and Grandmother    Social History   Tobacco Use  . Smoking status: Former Games developer  . Smokeless tobacco: Never Used  Vaping Use  . Vaping Use: Never used  Substance Use Topics  . Alcohol use: No  . Drug use: No    Prior to Admission medications   Medication Sig Start Date End Date Taking? Authorizing Provider  albuterol (VENTOLIN HFA) 108 (90 Base) MCG/ACT inhaler Inhale 1-2 puffs into the lungs every 6 (six) hours as needed for wheezing or shortness of breath. 01/22/20   Nira Conn, MD  aspirin-acetaminophen-caffeine (EXCEDRIN MIGRAINE) [phone] MG tablet Take by mouth every 6 (six) hours as needed for headache.    [provider]  hydrochlorothiazide (HYDRODIURIL) 25 MG tablet TK 1 T PO QD 11/10/14   [provider]  Multiple Vitamins-Minerals (MULTIVITAMINS THER. W/MINERALS) TABS Take 1 tablet by mouth daily.      [provider]   Norethindrone Acetate-Ethinyl Estrad-FE (LOESTRIN 24 FE) 1-20 MG-MCG(24) tablet Take by mouth. 01/27/18   [provider]  ondansetron (ZOFRAN ODT) 8 MG disintegrating tablet Take 1 tablet (8 mg total) by mouth every 8 (eight) hours as needed for nausea or vomiting. 02/17/20   Trinita Devlin, Jonny Ruiz, MD  pantoprazole (PROTONIX) 40 MG tablet Take 1 tablet (40 mg total) by mouth daily. 02/17/20   Ganon Demasi, MD    Allergies Penicillins and Lobster [shellfish allergy]   REVIEW OF SYSTEMS  Negative except as noted here or in the History of Present Illness.   PHYSICAL EXAMINATION  Initial Vital Signs Blood pressure (!) 150/96, pulse 88, temperature 98.4 F (36.9 C), resp. rate 18, height 5' 7.5" (1.715 m), weight 129.3 kg, SpO2 99 %.  Examination General: Well-developed, well-nourished female in no acute distress; appearance consistent with age of record HENT: normocephalic; atraumatic Eyes: Normal appearance Neck: supple Heart: regular rate and rhythm Lungs: clear to auscultation bilaterally Abdomen: soft; nondistended; mild epigastric tenderness; no masses or hepatosplenomegaly; bowel sounds present Extremities: No deformity; full range of motion; pulses normal Neurologic: Awake, alert and oriented; motor function intact in all extremities and symmetric; no facial droop Skin: Warm and dry Psychiatric: Normal mood and affect   RESULTS  Summary of this visit's results, reviewed and interpreted by myself:   EKG Interpretation  Date/Time:    Ventricular Rate:    PR Interval:  QRS Duration:   QT Interval:    QTC Calculation:   R Axis:     Text Interpretation:        Laboratory Studies: Results for orders placed or performed during the hospital encounter of 02/17/20 (from the past 24 hour(s))  Urinalysis, Routine w reflex microscopic     Status: Abnormal   Collection Time: 02/17/20  5:08 AM  Result Value Ref Range   Color, Urine YELLOW YELLOW   APPearance CLEAR CLEAR    Specific Gravity, Urine 1.020 1.005 - 1.030   pH 5.5 5.0 - 8.0   Glucose, UA NEGATIVE NEGATIVE mg/dL   Hgb urine dipstick SMALL (A) NEGATIVE   Bilirubin Urine NEGATIVE NEGATIVE   Ketones, ur NEGATIVE NEGATIVE mg/dL   Protein, ur NEGATIVE NEGATIVE mg/dL   Nitrite NEGATIVE NEGATIVE   Leukocytes,Ua NEGATIVE NEGATIVE  Pregnancy, urine     Status: None   Collection Time: 02/17/20  5:08 AM  Result Value Ref Range   Preg Test, Ur NEGATIVE NEGATIVE  CBC with Differential/Platelet     Status: None   Collection Time: 02/17/20  5:08 AM  Result Value Ref Range   WBC 7.5 4.0 - 10.5 K/uL   RBC 4.72 3.87 - 5.11 MIL/uL   Hemoglobin 12.7 12.0 - 15.0 g/dL   HCT 17.6 36 - 46 %   MCV 83.9 80.0 - 100.0 fL   MCH 26.9 26.0 - 34.0 pg   MCHC 32.1 30.0 - 36.0 g/dL   RDW 16.0 73.7 - 10.6 %   Platelets 252 150 - 400 K/uL   nRBC 0.0 0.0 - 0.2 %   Neutrophils Relative % 59 %   Neutro Abs 4.5 1.7 - 7.7 K/uL   Lymphocytes Relative 33 %   Lymphs Abs 2.5 0.7 - 4.0 K/uL   Monocytes Relative 7 %   Monocytes Absolute 0.5 0 - 1 K/uL   Eosinophils Relative 1 %   Eosinophils Absolute 0.1 0 - 0 K/uL   Basophils Relative 0 %   Basophils Absolute 0.0 0 - 0 K/uL   Immature Granulocytes 0 %   Abs Immature Granulocytes 0.02 0.00 - 0.07 K/uL  Comprehensive metabolic panel     Status: Abnormal   Collection Time: 02/17/20  5:08 AM  Result Value Ref Range   Sodium 137 135 - 145 mmol/L   Potassium 3.3 (L) 3.5 - 5.1 mmol/L   Chloride 102 98 - 111 mmol/L   CO2 25 22 - 32 mmol/L   Glucose, Bld 99 70 - 99 mg/dL   BUN 16 6 - 20 mg/dL   Creatinine, Ser 2.69 (H) 0.44 - 1.00 mg/dL   Calcium 9.4 8.9 - 48.5 mg/dL   Total Protein 7.1 6.5 - 8.1 g/dL   Albumin 3.5 3.5 - 5.0 g/dL   AST 26 15 - 41 U/L   ALT 27 0 - 44 U/L   Alkaline Phosphatase 42 38 - 126 U/L   Total Bilirubin 0.5 0.3 - 1.2 mg/dL   GFR calc non Af Amer >60 >60 mL/min   GFR calc Af Amer >60 >60 mL/min   Anion gap 10 5 - 15  Lipase, blood     Status: None    Collection Time: 02/17/20  5:08 AM  Result Value Ref Range   Lipase 25 11 - 51 U/L  Urinalysis, Microscopic (reflex)     Status: Abnormal   Collection Time: 02/17/20  5:08 AM  Result Value Ref Range   RBC / HPF 0-5 0 -  5 RBC/hpf   WBC, UA 0-5 0 - 5 WBC/hpf   Bacteria, UA FEW (A) NONE SEEN   Squamous Epithelial / LPF 0-5 0 - 5   Imaging Studies: CT ABDOMEN PELVIS W CONTRAST  Result Date: 02/17/2020 CLINICAL DATA:  Epigastric pain. Periumbilical pain. Sharp and constant pain. Feeling of fullness EXAM: CT ABDOMEN AND PELVIS WITH CONTRAST TECHNIQUE: Multidetector CT imaging of the abdomen and pelvis was performed using the standard protocol following bolus administration of intravenous contrast. CONTRAST:  OMNIPAQUE IOHEXOL 300 MG/ML  SOLN COMPARISON:  None. FINDINGS: Lower chest: Lung bases are clear. Hepatobiliary: No focal hepatic lesion. Postcholecystectomy. No biliary dilatation. Pancreas: Pancreas is normal. No ductal dilatation. No pancreatic inflammation. Spleen: Normal spleen Adrenals/urinary tract: Adrenal glands and kidneys are normal. The ureters and bladder normal. Stomach/Bowel: Stomach, small bowel, appendix, and cecum are normal. The colon and rectosigmoid colon are normal. Vascular/Lymphatic: Abdominal aorta is normal caliber. No periportal or retroperitoneal adenopathy. No pelvic adenopathy. Reproductive: Uterus and adnexa unremarkable. Other: Small fat filled umbilical hernia measures 15 mm x 19 mm (image 46/2). Musculoskeletal: No aggressive osseous lesion. IMPRESSION: 1. No acute findings in the abdomen pelvis. 2. Small fat filled umbilical hernia. 3. Postcholecystectomy. Electronically Signed   By: Genevive Bi M.D.   On: 02/17/2020 06:40    ED COURSE and MDM  Nursing notes, initial and subsequent vitals signs, including pulse oximetry, reviewed and interpreted by myself.  Vitals:   02/17/20 0406 02/17/20 0410  BP:  (!) 150/96  Pulse:  88  Resp:  18  Temp:  98.4  F (36.9 C)  SpO2:  99%  Weight: 129.3 kg   Height: 5' 7.5" (1.715 m)    Medications  ondansetron (ZOFRAN) injection 4 mg (has no administration in time range)  ondansetron (ZOFRAN) injection 4 mg (4 mg Intravenous Given 02/17/20 0509)  pantoprazole (PROTONIX) injection 40 mg (40 mg Intravenous Given 02/17/20 0509)  fentaNYL (SUBLIMAZE) injection 50 mcg (50 mcg Intravenous Given 02/17/20 0510)  iohexol (OMNIPAQUE) 300 MG/ML solution 100 mL (100 mLs Intravenous Contrast Given 02/17/20 0600)   6:46 AM Patient's pain is improved but she is still nauseated.  She was advised of reassuring CT scan and laboratory studies.  On examination her tenderness is epigastric.  We will trial her on a PPI as gastritis could be the source of her pain.   PROCEDURES  Procedures   ED DIAGNOSES     ICD-10-CM   1. Periumbilical abdominal pain  R10.33        Paula Libra, MD 02/17/20 [phone]

## 2020-02-17 NOTE — ED Triage Notes (Signed)
C/o abd pain around her umbilical area. States pain is sharp and constant. C/o nausea. C/o "burping" c/o feeling "full" states temp 99.8. states pain for the past week but worse the past couple days. Denies any urinary symptoms. Has taken ibuprofen yesterday.

## 2020-10-14 IMAGING — CT CT ABD-PELV W/ CM
2 of 4 series · 17 of 46 positions shown, 19 images · IV contrast (Omnipaque)
Comparison: None.

CLINICAL DATA: Epigastric pain. Periumbilical pain. Sharp and
constant pain. Feeling of fullness

EXAM:
CT ABDOMEN AND PELVIS WITH CONTRAST
TECHNIQUE: Multidetector CT imaging of the abdomen and pelvis was performed
using the standard protocol following bolus administration of
intravenous contrast.
CONTRAST:  100mL OMNIPAQUE IOHEXOL 300 MG/ML  SOLN

[Series 2: axial st · axial · 0.73mm/px · z∈[+1012,+1442]mm · 14 of 96 slices shown, 16 images]
[im 5/96  soft-tissue]
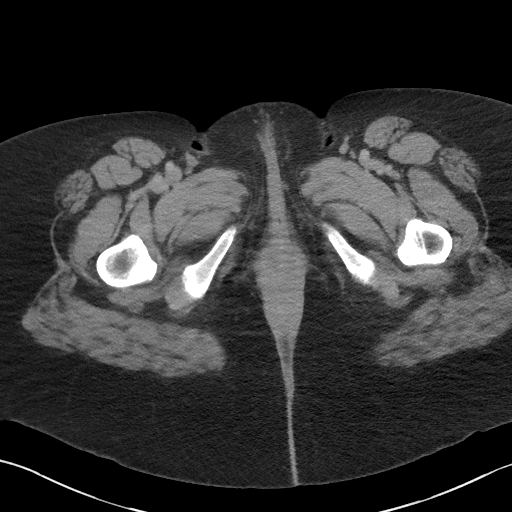
[im 5/96  bone]
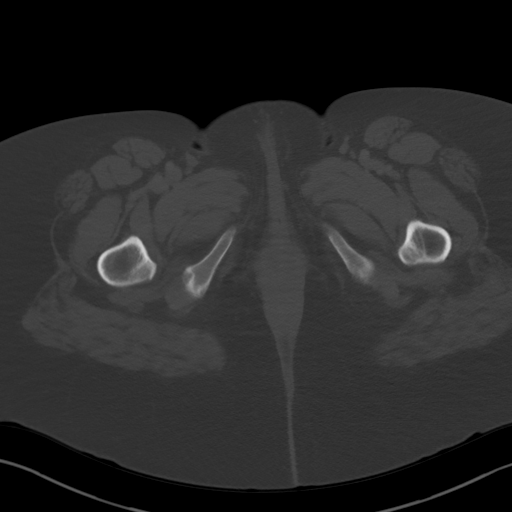
[im 13/96  soft-tissue]
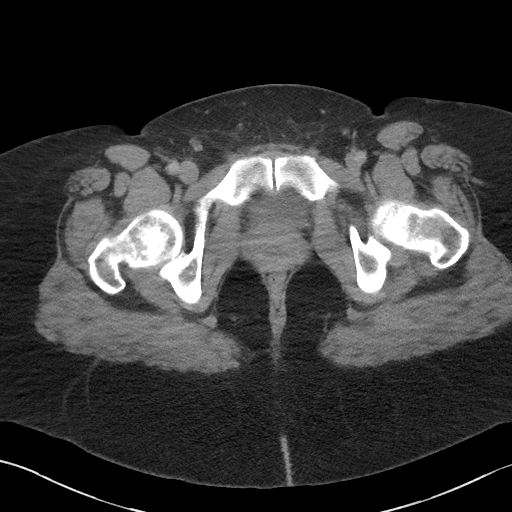
[im 17/96  soft-tissue]
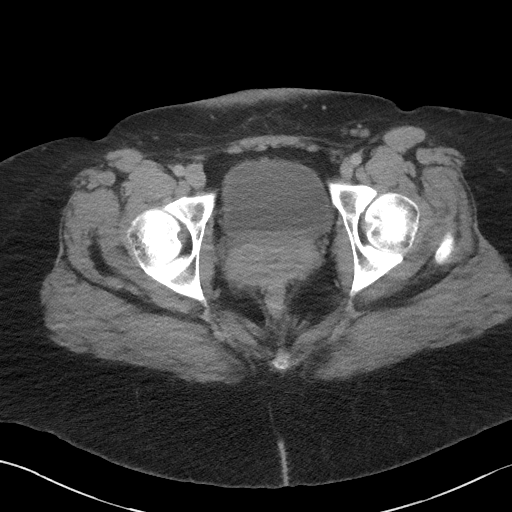
[im 25/96  soft-tissue]
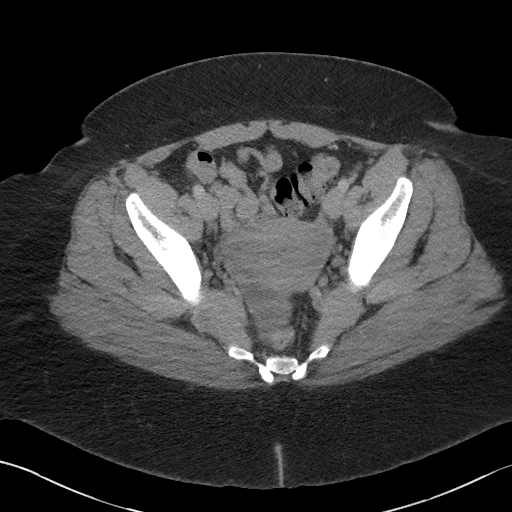
[im 34/96  soft-tissue]
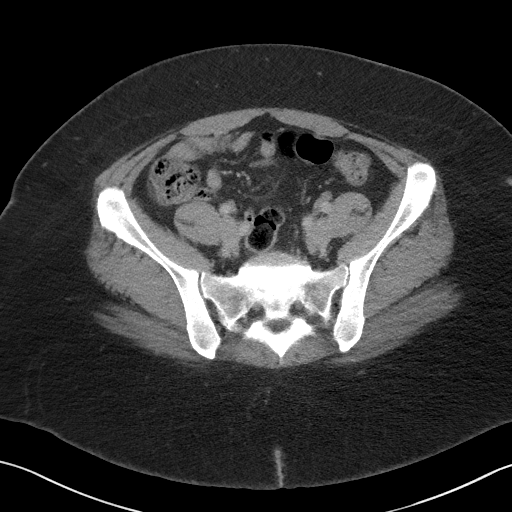
[im 38/96  soft-tissue]
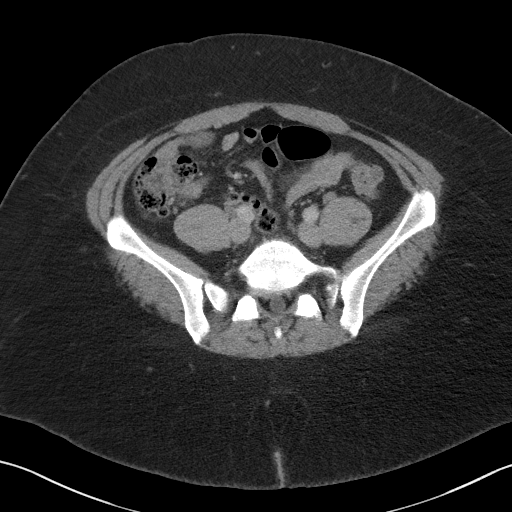
[im 46/96  soft-tissue]
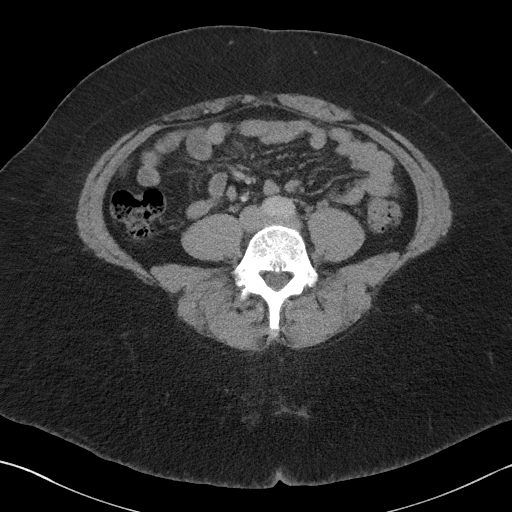
[im 50/96  soft-tissue]
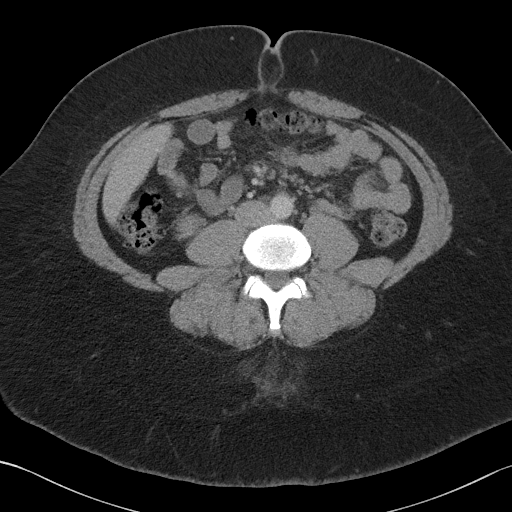
[im 58/96  soft-tissue]
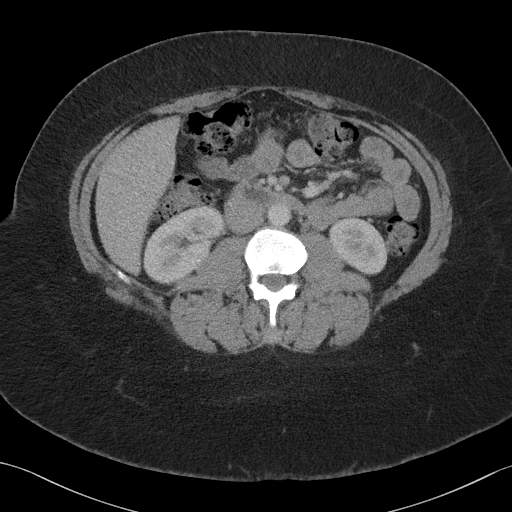
[im 58/96  bone]
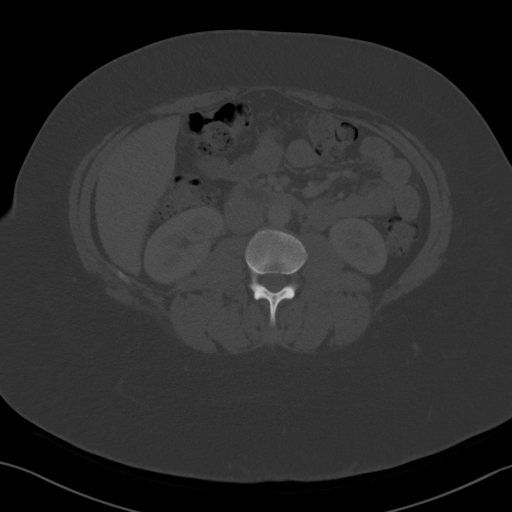
[im 62/96  soft-tissue]
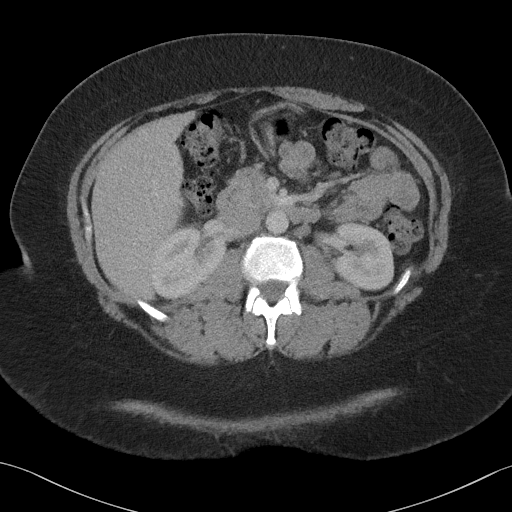
[im 71/96  soft-tissue]
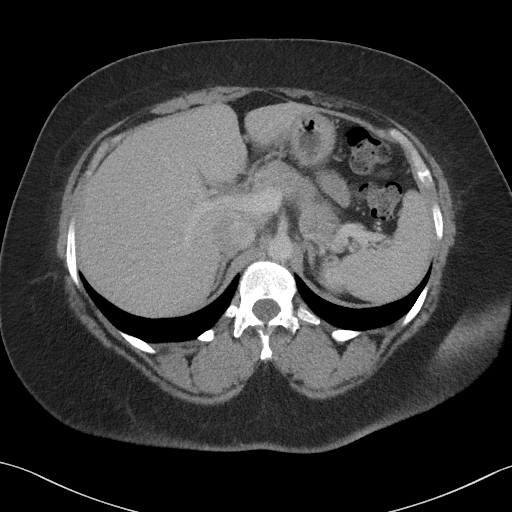
[im 79/96  soft-tissue]
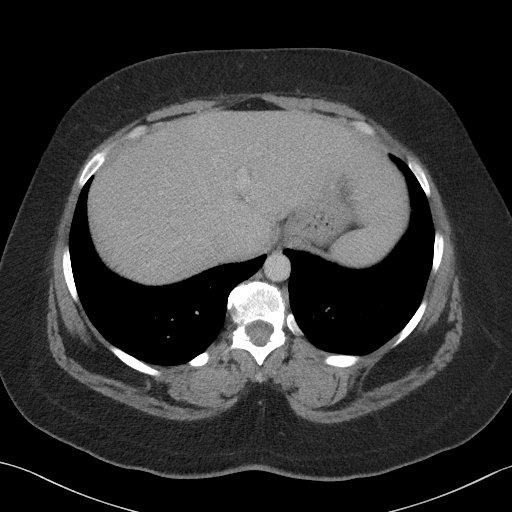
[im 83/96  soft-tissue]
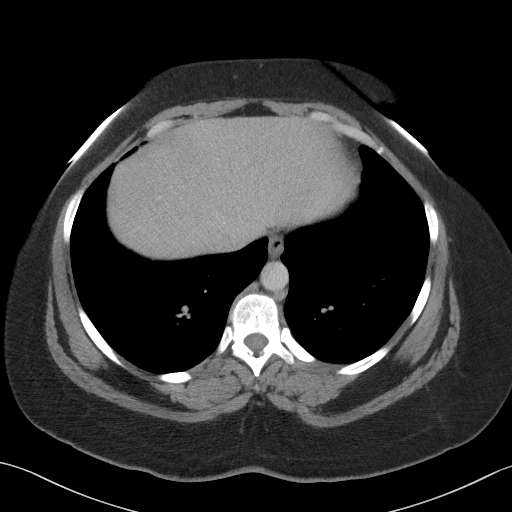
[im 91/96  soft-tissue]
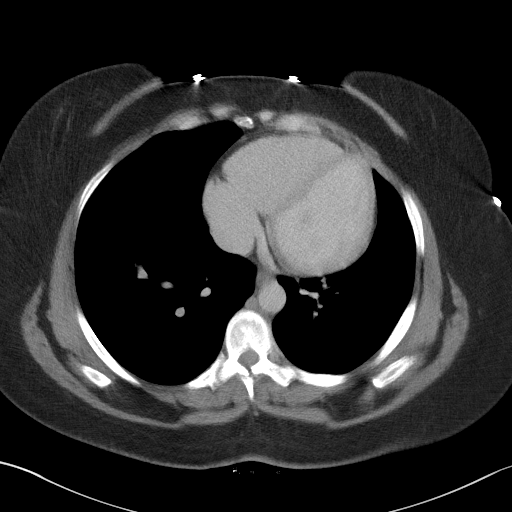

[Series 5: coronal st · coronal · 0.90mm/px · 3 of 94 slices shown]
[im 32/94  soft-tissue]
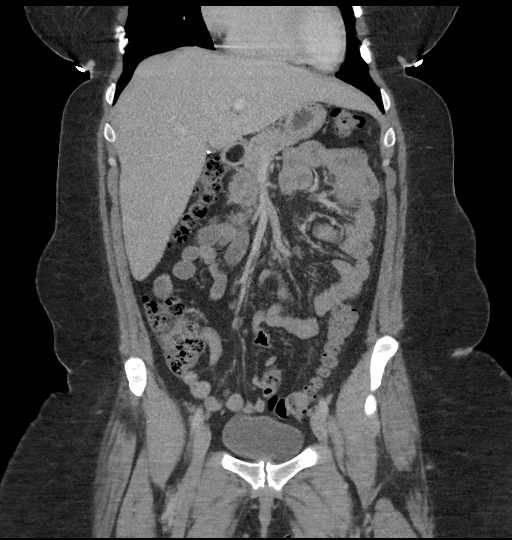
[im 42/94  soft-tissue]
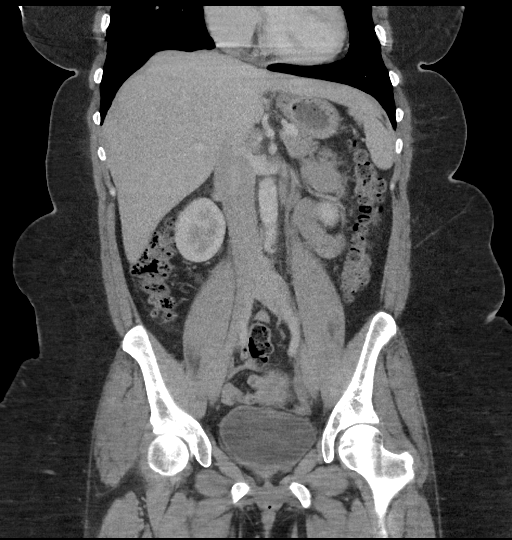
[im 52/94  soft-tissue]
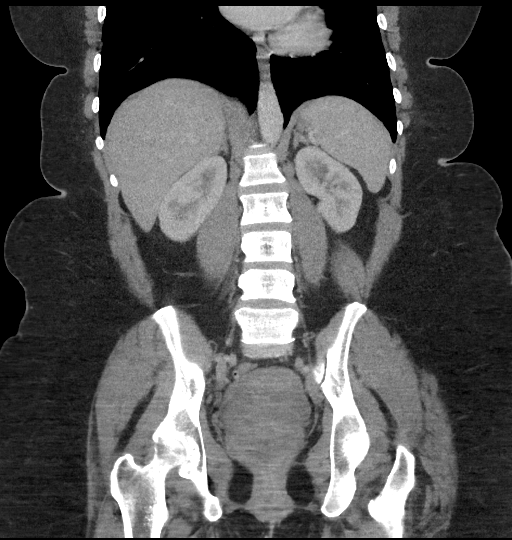

[17 of 46 positions shown; findings below may reference images not displayed]

FINDINGS: Lower chest: Lung bases are clear.

Hepatobiliary: No focal hepatic lesion. Postcholecystectomy. No
biliary dilatation.

Pancreas: Pancreas is normal. No ductal dilatation. No pancreatic
inflammation.

Spleen: Normal spleen

Adrenals/urinary tract: Adrenal glands and kidneys are normal. The
ureters and bladder normal.

Stomach/Bowel: Stomach, small bowel, appendix, and cecum are normal.
The colon and rectosigmoid colon are normal.

Vascular/Lymphatic: Abdominal aorta is normal caliber. No periportal
or retroperitoneal adenopathy. No pelvic adenopathy.

Reproductive: Uterus and adnexa unremarkable.

Other: Small fat filled umbilical hernia measures 15 mm x 19 mm
(image 46/2).

Musculoskeletal: No aggressive osseous lesion.
IMPRESSION: 1. No acute findings in the abdomen pelvis.
2. Small fat filled umbilical hernia.
3. Postcholecystectomy.

## 2021-01-09 ENCOUNTER — Other Ambulatory Visit: Payer: Self-pay

## 2021-01-09 ENCOUNTER — Other Ambulatory Visit (HOSPITAL_BASED_OUTPATIENT_CLINIC_OR_DEPARTMENT_OTHER): Payer: Self-pay

## 2021-01-09 ENCOUNTER — Encounter (HOSPITAL_BASED_OUTPATIENT_CLINIC_OR_DEPARTMENT_OTHER): Payer: Self-pay | Admitting: Emergency Medicine

## 2021-01-09 ENCOUNTER — Emergency Department (HOSPITAL_BASED_OUTPATIENT_CLINIC_OR_DEPARTMENT_OTHER): Payer: Medicaid Other

## 2021-01-09 ENCOUNTER — Emergency Department (HOSPITAL_BASED_OUTPATIENT_CLINIC_OR_DEPARTMENT_OTHER)
Admission: EM | Admit: 2021-01-09 | Discharge: 2021-01-09 | Disposition: A | Discharge status: Home / Self Care | Payer: Medicaid Other | Attending: Emergency Medicine | Admitting: Emergency Medicine

## 2021-01-09 DIAGNOSIS — Z87891 Personal history of nicotine dependence: Secondary | ICD-10-CM | POA: Insufficient documentation

## 2021-01-09 DIAGNOSIS — R072 Precordial pain: Secondary | ICD-10-CM | POA: Diagnosis present

## 2021-01-09 DIAGNOSIS — R0789 Other chest pain: Secondary | ICD-10-CM

## 2021-01-09 LAB — CBC WITH DIFFERENTIAL/PLATELET
Abs Immature Granulocytes: 0.01 10*3/uL (ref 0.00–0.07)
Basophils Absolute: 0.1 10*3/uL (ref 0.0–0.1)
Basophils Relative: 1 %
Eosinophils Absolute: 0.1 10*3/uL (ref 0.0–0.5)
Eosinophils Relative: 1 %
HCT: 38.5 % (ref 36.0–46.0)
Hemoglobin: 12.6 g/dL (ref 12.0–15.0)
Immature Granulocytes: 0 %
Lymphocytes Relative: 31 %
Lymphs Abs: 2.2 10*3/uL (ref 0.7–4.0)
MCH: 27.1 pg (ref 26.0–34.0)
MCHC: 32.7 g/dL (ref 30.0–36.0)
MCV: 82.8 fL (ref 80.0–100.0)
Monocytes Absolute: 0.3 10*3/uL (ref 0.1–1.0)
Monocytes Relative: 5 %
Neutro Abs: 4.2 10*3/uL (ref 1.7–7.7)
Neutrophils Relative %: 62 %
Platelets: 310 10*3/uL (ref 150–400)
RBC: 4.65 MIL/uL (ref 3.87–5.11)
RDW: 14.5 % (ref 11.5–15.5)
WBC: 6.9 10*3/uL (ref 4.0–10.5)
nRBC: 0 % (ref 0.0–0.2)

## 2021-01-09 LAB — BASIC METABOLIC PANEL
Anion gap: 9 (ref 5–15)
BUN: 9 mg/dL (ref 6–20)
CO2: 24 mmol/L (ref 22–32)
Calcium: 9.1 mg/dL (ref 8.9–10.3)
Chloride: 104 mmol/L (ref 98–111)
Creatinine, Ser: 1.17 mg/dL — ABNORMAL HIGH (ref 0.44–1.00)
GFR, Estimated: >60 mL/min (ref >60)
Glucose, Bld: 90 mg/dL (ref 70–99)
Potassium: 3.7 mmol/L (ref 3.5–5.1)
Sodium: 137 mmol/L (ref 135–145)

## 2021-01-09 LAB — D-DIMER, QUANTITATIVE: D-Dimer, Quant: <0.27 ug/mL-FEU (ref 0.00–0.50)

## 2021-01-09 LAB — TROPONIN I (HIGH SENSITIVITY): Troponin I (High Sensitivity): 5 ng/L (ref <18)

## 2021-01-09 MED ORDER — PANTOPRAZOLE SODIUM 20 MG PO TBEC
20.0000 mg | DELAYED_RELEASE_TABLET | Freq: Every day | ORAL | 0 refills | Status: DC
  Filled 2021-01-09: qty 14, 14d supply, fill #0

## 2021-01-09 MED ORDER — ALUM & MAG HYDROXIDE-SIMETH 200-200-20 MG/5ML PO SUSP
30.0000 mL | Freq: Once | ORAL | Status: AC
  Administered 2021-01-09: 30 mL via ORAL
  Filled 2021-01-09: qty 30

## 2021-01-09 NOTE — ED Triage Notes (Signed)
Reports epigastric pain that started this morning when she woke up.  Describes as stabbing pain when she breathes in or moves.  Also reports feeling sob.  Denies any n/v.

## 2021-01-09 NOTE — ED Provider Notes (Signed)
[facility] EMERGENCY DEPARTMENT Provider Note   CSN: [id number] Arrival date & time: 01/09/21  1829     History Chief Complaint  Patient presents with   Chest Pain    Kendra Fowler is a 40 y.o. female.  The history is provided by the patient.  Chest Pain Pain location:  Substernal area Pain quality: aching   Pain radiates to:  Does not radiate Onset quality:  Gradual Duration:  3 hours Timing:  Constant Progression:  Unchanged Chronicity:  New Context: breathing   Relieved by:  Nothing Worsened by:  Nothing Associated symptoms: no abdominal pain, no back pain, no cough, no fever, no palpitations, no shortness of breath and no vomiting   Risk factors: birth control   Risk factors: no coronary artery disease, no high cholesterol and no hypertension       Past Medical History:  Diagnosis Date   Gallbladder attack    Ovarian cyst     Patient Active Problem List   Diagnosis Date Noted   Symptomatic cholelithiasis 10/12/2012    Past Surgical History:  Procedure Laterality Date   CHOLECYSTECTOMY       OB History   No obstetric history on file.     Family History  Problem Relation Age of Onset   Breast cancer Other        Aunt and Grandmother    Social History   Tobacco Use   Smoking status: Former   Smokeless tobacco: Never  Building services engineer Use: Never used  Substance Use Topics   Alcohol use: No   Drug use: No    Home Medications Prior to Admission medications   Medication Sig Start Date End Date Taking? Authorizing Provider  albuterol (VENTOLIN HFA) 108 (90 Base) MCG/ACT inhaler Inhale 1-2 puffs into the lungs every 6 (six) hours as needed for wheezing or shortness of breath. 01/22/20   Nira Conn, MD  aspirin-acetaminophen-caffeine (EXCEDRIN MIGRAINE) [phone] MG tablet Take by mouth every 6 (six) hours as needed for headache.    [provider]  hydrochlorothiazide (HYDRODIURIL) 25 MG tablet TK 1 T  PO QD 11/10/14   [provider]  Multiple Vitamins-Minerals (MULTIVITAMINS THER. W/MINERALS) TABS Take 1 tablet by mouth daily.      [provider]  Norethindrone Acetate-Ethinyl Estrad-FE (LOESTRIN 24 FE) 1-20 MG-MCG(24) tablet Take by mouth. 01/27/18   [provider]  ondansetron (ZOFRAN ODT) 8 MG disintegrating tablet Take 1 tablet (8 mg total) by mouth every 8 (eight) hours as needed for nausea or vomiting. 02/17/20   Molpus, Jonny Ruiz, MD  pantoprazole (PROTONIX) 20 MG tablet Take 1 tablet (20 mg total) by mouth daily for 14 days. 01/09/21 01/23/21  Virgina Norfolk, DO    Allergies    Penicillins and Lobster [shellfish allergy]  Review of Systems   Review of Systems  Constitutional:  Negative for chills and fever.  HENT:  Negative for ear pain and sore throat.   Eyes:  Negative for pain and visual disturbance.  Respiratory:  Negative for cough and shortness of breath.   Cardiovascular:  Positive for chest pain. Negative for palpitations.  Gastrointestinal:  Negative for abdominal pain and vomiting.  Genitourinary:  Negative for dysuria and hematuria.  Musculoskeletal:  Negative for arthralgias and back pain.  Skin:  Negative for color change and rash.  Neurological:  Negative for seizures and syncope.  All other systems reviewed and are negative.  Physical Exam Updated Vital Signs BP (!) 127/92 (  BP Location: Right Arm)   Pulse 80   Temp 98.1 F (36.7 C) (Oral)   Resp (!) 24   Ht 5' 7.5" (1.715 m)   Wt 122.5 kg   LMP 12/03/2020 Comment: reports irregular due to birth control  SpO2 99%   BMI 41.66 kg/m   Physical Exam Vitals and nursing note reviewed.  Constitutional:      General: She is not in acute distress.    Appearance: She is well-developed. She is not ill-appearing.  HENT:     Head: Normocephalic and atraumatic.  Eyes:     Extraocular Movements: Extraocular movements intact.     Conjunctiva/sclera: Conjunctivae normal.     Pupils: Pupils are  equal, round, and reactive to light.  Cardiovascular:     Rate and Rhythm: Normal rate and regular rhythm.     Pulses:          Radial pulses are 2+ on the right side and 2+ on the left side.     Heart sounds: Normal heart sounds. No murmur heard. Pulmonary:     Effort: Pulmonary effort is normal. No respiratory distress.     Breath sounds: Normal breath sounds.  Abdominal:     Palpations: Abdomen is soft.     Tenderness: There is no abdominal tenderness.  Musculoskeletal:     Cervical back: Normal range of motion and neck supple.     Right lower leg: No edema.     Left lower leg: No edema.  Skin:    General: Skin is warm and dry.  Neurological:     Mental Status: She is alert.    ED Results / Procedures / Treatments   Labs (all labs ordered are listed, but only abnormal results are displayed) Labs Reviewed  BASIC METABOLIC PANEL - Abnormal; Notable for the following components:      Result Value   Creatinine, Ser 1.17 (*)    All other components within normal limits  CBC WITH DIFFERENTIAL/PLATELET  D-DIMER, QUANTITATIVE  TROPONIN I (HIGH SENSITIVITY)    EKG EKG Interpretation  Date/Time:  Friday January 09 2021 09:29:31 EDT Ventricular Rate:  78 PR Interval:  177 QRS Duration: 100 QT Interval:  369 QTC Calculation: 421 R Axis:   85 Text Interpretation: Sinus rhythm Artifact in lead(s) I III aVR aVL aVF V1 V2 Confirmed by Virgina Norfolk ([id number]) on 01/09/2021 9:39:56 AM  Radiology DG Chest Portable 1 View  Result Date: 01/09/2021 CLINICAL DATA:  Epigastric pain EXAM: PORTABLE CHEST 1 VIEW COMPARISON:  None. FINDINGS: The heart size and mediastinal contours are within normal limits. Both lungs are clear. The visualized skeletal structures are unremarkable. IMPRESSION: No active disease. Electronically Signed   By: Allegra Lai MD   On: 01/09/2021 10:21    Procedures Procedures   Medications Ordered in ED Medications  alum & mag hydroxide-simeth (MAALOX/MYLANTA)  200-200-20 MG/5ML suspension 30 mL (30 mLs Oral Given 01/09/21 1005)    ED Course  I have reviewed the triage vital signs and the nursing notes.  Pertinent labs & imaging results that were available during my care of the patient were reviewed by me and considered in my medical decision making (see chart for details).    MDM Rules/Calculators/A&P                           [name] is here with chest pain.  Overall atypical story.  Normal vitals.  No fever.  Does take birth control.  Will get D-dimer to evaluate for PE.  Will get troponin.  EKG shows sinus rhythm.  No ischemic changes.  No cardiac risk factors.  Could be GI related pain or MSK related pain or anxiety.  Will rule out ACS and PE with blood work and imaging if needed.  Will evaluate for pneumonia with chest x-ray.  Denies any infectious symptoms.  D-dimer and troponin within normal limits.  Doubt ACS/doubt PE.  No significant anemia, electrolyte abnormality, kidney injury otherwise.  Overall no cardiac risk factors.  Symptoms appear more consistent with GI related issue.  Chest x-ray with no pneumonia or pneumothorax.  We will restart her on Protonix.  Understands return precautions and discharged in the ED in good condition.  This chart was dictated using voice recognition software.  Despite best efforts to proofread,  errors can occur which can change the documentation meaning.   Final Clinical Impression(s) / ED Diagnoses Final diagnoses:  Atypical chest pain    Rx / DC Orders ED Discharge Orders          Ordered    pantoprazole (PROTONIX) 20 MG tablet  Daily        01/09/21 1055             [place], Madelaine Bhat, DO 01/09/21 1056

## 2021-01-09 NOTE — Discharge Instructions (Signed)
Overall your work-up is unremarkable.  Recommend taking 1000 mg of Tylenol every 6 hours as needed for discomfort.  Consider taking 400 mg ibuprofen every 8 hours as needed as well.  We will restart you on Protonix for a 2-week course for possible reflux.  Please return if symptoms become more concerning but recommend close follow-up with your primary care doctor.

## 2021-01-19 ENCOUNTER — Other Ambulatory Visit (HOSPITAL_BASED_OUTPATIENT_CLINIC_OR_DEPARTMENT_OTHER): Payer: Self-pay

## 2022-08-28 ENCOUNTER — Other Ambulatory Visit: Payer: Self-pay

## 2022-08-28 ENCOUNTER — Emergency Department (HOSPITAL_BASED_OUTPATIENT_CLINIC_OR_DEPARTMENT_OTHER)
Admission: EM | Admit: 2022-08-28 | Discharge: 2022-08-28 | Disposition: A | Discharge status: Home / Self Care | Payer: Medicaid Other | Attending: Emergency Medicine | Admitting: Emergency Medicine

## 2022-08-28 ENCOUNTER — Emergency Department (HOSPITAL_BASED_OUTPATIENT_CLINIC_OR_DEPARTMENT_OTHER): Payer: Medicaid Other

## 2022-08-28 ENCOUNTER — Encounter (HOSPITAL_BASED_OUTPATIENT_CLINIC_OR_DEPARTMENT_OTHER): Payer: Self-pay

## 2022-08-28 DIAGNOSIS — M7989 Other specified soft tissue disorders: Secondary | ICD-10-CM

## 2022-08-28 DIAGNOSIS — M7122 Synovial cyst of popliteal space [Baker], left knee: Secondary | ICD-10-CM | POA: Insufficient documentation

## 2022-08-28 DIAGNOSIS — M79662 Pain in left lower leg: Secondary | ICD-10-CM | POA: Diagnosis present

## 2022-08-28 DIAGNOSIS — Z7982 Long term (current) use of aspirin: Secondary | ICD-10-CM | POA: Diagnosis not present

## 2022-08-28 HISTORY — DX: Polycystic ovarian syndrome: E28.2

## 2022-08-28 HISTORY — DX: Disorder of thyroid, unspecified: E07.9

## 2022-08-28 HISTORY — DX: Unspecified osteoarthritis, unspecified site: M19.90

## 2022-08-28 MED ORDER — DICLOFENAC SODIUM 1 % EX GEL: 2.0000 g | Freq: Four times a day (QID) | CUTANEOUS | 1 refills | Status: DC

## 2022-08-28 MED ORDER — PREDNISONE 20 MG PO TABS: 40.0000 mg | ORAL_TABLET | Freq: Every day | ORAL | 0 refills | Status: DC

## 2022-08-28 MED ORDER — OXYCODONE-ACETAMINOPHEN 5-325 MG PO TABS
1.0000 | ORAL_TABLET | Freq: Once | ORAL | Status: AC
  Administered 2022-08-28: 1 via ORAL
  Filled 2022-08-28: qty 1

## 2022-08-28 MED ORDER — NAPROXEN 500 MG PO TABS: 500.0000 mg | ORAL_TABLET | Freq: Two times a day (BID) | ORAL | 0 refills | Status: DC

## 2022-08-28 NOTE — Discharge Instructions (Addendum)
There is no evidence of blood clot today.  But you do have a Baker's cyst.  You were given a prescription for medications to use for pain and inflammation.  It will be important to follow-up with the orthopedist if the pain does not improve.

## 2022-08-28 NOTE — ED Provider Notes (Signed)
[facility] Provider Note   CSN: [id number] Arrival date & time: 08/28/22  0151     History  Chief Complaint  Patient presents with   Leg Pain    Kendra Fowler is a 42 y.o. female.  The history is provided by the patient.  Leg Pain She has history of polycystic ovarian syndrome and comes in with pain in the left lower leg for about the last 10 days.  Pain starts at the bend of the knee and goes down into her calf.  She did see her primary care provider who thought it was arthritis and gave her a steroid injection in her but, but it has not improved.  She has taken over-the-counter NSAIDs, acetaminophen and also tramadol with only minimal relief.  She denies any recent trauma.  She does have history of arthritis.  She denies any chest pain or difficulty breathing.   Home Medications Prior to Admission medications   Medication Sig Start Date End Date Taking? Authorizing Provider  albuterol (VENTOLIN HFA) 108 (90 Base) MCG/ACT inhaler Inhale 1-2 puffs into the lungs every 6 (six) hours as needed for wheezing or shortness of breath. 01/22/20   Fatima Blank, MD  aspirin-acetaminophen-caffeine (EXCEDRIN MIGRAINE) [phone] MG tablet Take by mouth every 6 (six) hours as needed for headache.    [provider]  hydrochlorothiazide (HYDRODIURIL) 25 MG tablet TK 1 T PO QD 11/10/14   [provider]  Multiple Vitamins-Minerals (MULTIVITAMINS THER. W/MINERALS) TABS Take 1 tablet by mouth daily.      [provider]  Norethindrone Acetate-Ethinyl Estrad-FE (LOESTRIN 24 FE) 1-20 MG-MCG(24) tablet Take by mouth. 01/27/18   [provider]  ondansetron (ZOFRAN ODT) 8 MG disintegrating tablet Take 1 tablet (8 mg total) by mouth every 8 (eight) hours as needed for nausea or vomiting. 02/17/20   Molpus, Jenny Reichmann, MD  pantoprazole (PROTONIX) 20 MG tablet Take 1 tablet (20 mg total) by mouth daily for 14 days. 01/09/21  01/23/21  Lennice Sites, DO      Allergies    Penicillins and Lobster [shellfish allergy]    Review of Systems   Review of Systems  All other systems reviewed and are negative.   Physical Exam Updated Vital Signs BP (!) 144/87   Pulse 86   Temp 98.7 F (37.1 C) (Oral)   Resp 18   Ht 5\' 7"  (1.702 m)   Wt (!) 141.1 kg   LMP 08/22/2022   SpO2 100%   BMI 48.71 kg/m  Physical Exam Vitals and nursing note reviewed.   42 year old female, resting comfortably and in no acute distress. Vital signs are significant for mildly elevated blood pressure. Oxygen saturation is 100%, which is normal. Head is normocephalic and atraumatic. PERRLA, EOMI. Oropharynx is clear. Neck is nontender and supple without adenopathy or JVD. Back is nontender and there is no CVA tenderness. Lungs are clear without rales, wheezes, or rhonchi. Chest is nontender. Heart has regular rate and rhythm without murmur. Abdomen is soft, flat, nontender without masses or hepatosplenomegaly and peristalsis is normoactive. Extremities: There is slight swelling of the left calf compared with the right (left calf circumference 2 cm greater than right calf circumference).  There is tenderness to palpation in the left popliteal space but no calf tenderness and negative Homans' sign.  Dorsalis pedis is strong, capillary refill is prompt. Skin is warm and dry without rash. Neurologic: Mental status is normal, cranial nerves are intact,  moves all extremities equally.  ED Results / Procedures / Treatments   Labs (all labs ordered are listed, but only abnormal results are displayed) Labs Reviewed - No data to display  EKG None  Radiology No results found.  Procedures Procedures    Medications Ordered in ED Medications - No data to display  ED Course/ Medical Decision Making/ A&P                             Medical Decision Making Risk Prescription drug management.   Left calf pain with tenderness localized  to the popliteal space suspicious for ruptured Baker's cyst.  However, with calf swelling, need to consider possibility of DVT.  I have ordered a venous ultrasound, I have ordered a dose of oxycodone-acetaminophen for pain.  Case is signed out to Dr. Gertie Gowda, oncoming physician.  Final Clinical Impression(s) / ED Diagnoses Final diagnoses:  Pain and swelling of left lower leg    Rx / DC Orders ED Discharge Orders     None         Delora Fuel, MD [id number] [phone]

## 2022-08-28 NOTE — ED Provider Notes (Signed)
I have independently visualized and interpreted pt's images today.  Ultrasound negative today for DVT.  Patient does have a Baker's cyst.  Findings were discussed with the patient.  She was discharged home with supportive care and will follow-up with the orthopedist she has already been in contact with.    Blanchie Dessert, MD 08/28/22 [phone]

## 2022-08-28 NOTE — ED Triage Notes (Addendum)
Pt with pain that is radiating from top of the back of the knee and shooting down. Pt with arthritis in knees and was given steroid injection by PCP and muscle relaxers with mild help 1 week ago. Pt reports difficult to walk on LLE. No hx of DVT; cool to touch, sensation intact, DP 2+, no redness.

## 2022-09-08 ENCOUNTER — Other Ambulatory Visit (HOSPITAL_BASED_OUTPATIENT_CLINIC_OR_DEPARTMENT_OTHER): Payer: Self-pay

## 2022-09-15 ENCOUNTER — Other Ambulatory Visit: Payer: Self-pay

## 2022-09-15 DIAGNOSIS — M7989 Other specified soft tissue disorders: Secondary | ICD-10-CM

## 2022-09-21 ENCOUNTER — Other Ambulatory Visit (HOSPITAL_BASED_OUTPATIENT_CLINIC_OR_DEPARTMENT_OTHER): Payer: Self-pay

## 2022-09-21 MED ORDER — ZEPBOUND 7.5 MG/0.5ML ~~LOC~~ SOAJ
7.5000 mg | SUBCUTANEOUS | 0 refills | Status: DC
  Filled 2022-09-21: qty 2, 28d supply, fill #0

## 2022-09-22 NOTE — Progress Notes (Unsigned)
[facility]           [facility] [place]  History and Physical   Kendra Fowler is a 42 y.o. female who presents with bilateral leg swelling with left worse than right.  She states that the swelling is better in the mornings.  She states that she has never had a DVT.  She does not have skin color changes.  She states that her legs feel tired, achy and heavy at the end of the day.   She states she does get pain in her legs.  She has a hx of a baker's cyst on the left.  She went to have it aspirated a couple of weeks ago and it appeared ruptured, which was confirmed on u/s today.  She works from home and does quite a bit of sitting.  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  She will also get some numbness in the lower leg at times.  She states that prior to covid, she lost 70 lbs doing the keto diet and then covid hit and she gained back her weight plus extra.  She is not working with nutritionist and is on Zepbound and has lost 20 lbs since starting the medication.    Her family hx is that her grandmother had swelling of her legs.  Her aunt has a hx of lipedema.   Her mother passed away when she was 59 months old.   The pt is not on a statin for cholesterol management.  The pt is not on a daily aspirin.   Other AC:  none The pt is on diuretic for hypertension.   The pt is not on medication for diabetes.   Tobacco hx:  former  Pt does not have family hx of AAA.  Past Medical History:  Diagnosis Date   Arthritis    Gallbladder attack    Ovarian cyst    PCOS (polycystic ovarian syndrome)    Thyroid disease     Past Surgical History:  Procedure Laterality Date   CHOLECYSTECTOMY     WISDOM TOOTH EXTRACTION      Social History   Socioeconomic History   Marital status: Married    Spouse name: Not on file   Number of children: Not on file   Years of education: Not on file   Highest education level: Not on file  Occupational  History   Not on file  Tobacco Use   Smoking status: Former   Smokeless tobacco: Never  Vaping Use   Vaping Use: Never used  Substance and Sexual Activity   Alcohol use: No   Drug use: No   Sexual activity: Not on file  Other Topics Concern   Not on file  Social History Narrative   Not on file   Social Determinants of Health   Financial Resource Strain: Not on file  Food Insecurity: Not on file  Transportation Needs: Not on file  Physical Activity: Not on file  Stress: Not on file  Social Connections: Not on file  Intimate Partner Violence: Not on file     Family History  Problem Relation Age of Onset   Breast cancer Other        Aunt and Grandmother    Current Outpatient Medications  Medication Sig Dispense Refill   albuterol (VENTOLIN HFA) 108 (90 Base) MCG/ACT inhaler Inhale 1-2 puffs into the lungs every 6 (six) hours as  needed for wheezing or shortness of breath. 8 g 0   aspirin-acetaminophen-caffeine (EXCEDRIN MIGRAINE) 250-250-65 MG tablet Take by mouth every 6 (six) hours as needed for headache.     diclofenac Sodium (VOLTAREN) 1 % GEL Apply 2 g topically 4 (four) times daily. Apply to the left knee 150 g 1   hydrochlorothiazide (HYDRODIURIL) 25 MG tablet TK 1 T PO QD     Multiple Vitamins-Minerals (MULTIVITAMINS THER. W/MINERALS) TABS Take 1 tablet by mouth daily.       naproxen (NAPROSYN) 500 MG tablet Take 1 tablet (500 mg total) by mouth 2 (two) times daily. 20 tablet 0   Norethindrone Acetate-Ethinyl Estrad-FE (LOESTRIN 24 FE) 1-20 MG-MCG(24) tablet Take by mouth.     ondansetron (ZOFRAN ODT) 8 MG disintegrating tablet Take 1 tablet (8 mg total) by mouth every 8 (eight) hours as needed for nausea or vomiting. 10 tablet 1   pantoprazole (PROTONIX) 20 MG tablet Take 1 tablet (20 mg total) by mouth daily for 14 days. 14 tablet 0   predniSONE (DELTASONE) 20 MG tablet Take 2 tablets (40 mg total) by mouth daily. 10 tablet 0   tirzepatide (ZEPBOUND) 7.5 MG/0.5ML  Pen Inject 7.5 mg into the skin once a week. 2 mL 0   No current facility-administered medications for this visit.    Allergies  Allergen Reactions   Penicillins Anaphylaxis   Lobster [Shellfish Allergy] Nausea And Vomiting    REVIEW OF SYSTEMS:   [X]  denotes positive finding, [ ]  denotes negative finding Cardiac  Comments:  Chest pain or chest pressure:    Shortness of breath upon exertion:    Short of breath when lying flat:    Irregular heart rhythm:        Vascular    Pain in calf, thigh, or hip brought on by ambulation:    Pain in feet at night that wakes you up from your sleep:     Blood clot in your veins:    Leg swelling:  x See HPI      Pulmonary    Oxygen at home:    Productive cough:     Wheezing:         Neurologic    Sudden weakness in arms or legs:     Sudden numbness in arms or legs:     Sudden onset of difficulty speaking or slurred speech:    Temporary loss of vision in one eye:     Problems with dizziness:         Gastrointestinal    Blood in stool:     Vomited blood:         Genitourinary    Burning when urinating:     Blood in urine:        Psychiatric    Major depression:         Hematologic    Bleeding problems:    Problems with blood clotting too easily:        Skin    Rashes or ulcers:        Constitutional    Fever or chills:      PHYSICAL EXAMINATION:  Today's Vitals   09/23/22 1425  BP: (!) 154/99  Pulse: 74  Resp: 20  Temp: 98 F (36.7 C)  SpO2: 99%  Weight: (!) 315 lb (142.9 kg)  Height: 5\' 7"  (1.702 m)   Body mass index is 49.34 kg/m.   General:  WDWN in NAD; vital signs documented above Gait: Not  observed HENT: WNL, normocephalic Pulmonary: normal non-labored breathing without wheezing Cardiac: regular HR; without carotid bruits Abdomen: soft, NT, aortic pulse is not palpable Skin: without rashes Vascular Exam/Pulses:  Right Left  Radial 2+ (normal) 2+ (normal)  DP 2+ (normal) 2+ (normal)  PT 2+  (normal) 2+ (normal)   Extremities: BLE swelling with left worse than right.  - stemmer sign  Neurologic: A&O X 3;  moving all extremities equally Psychiatric:  The pt has Normal affect.   Non-Invasive Vascular Imaging:   Venous duplex on 09/23/2022: +--------------+---------+------+-----------+------------+--------+  LEFT         Reflux NoRefluxReflux TimeDiameter cmsComments                          Yes                                   +--------------+---------+------+-----------+------------+--------+  CFV          no                                              +--------------+---------+------+-----------+------------+--------+  FV prox       no                                              +--------------+---------+------+-----------+------------+--------+  FV mid        no                                              +--------------+---------+------+-----------+------------+--------+  FV dist       no                                              +--------------+---------+------+-----------+------------+--------+  Popliteal    no                                              +--------------+---------+------+-----------+------------+--------+  GSV at SFJ              yes    >500 ms      0.84              +--------------+---------+------+-----------+------------+--------+  GSV prox thigh          yes    >500 ms      0.77              +--------------+---------+------+-----------+------------+--------+  GSV mid thigh           yes    >500 ms      0.33              +--------------+---------+------+-----------+------------+--------+  GSV dist thigh          yes    >500 ms      0.27              +--------------+---------+------+-----------+------------+--------+  GSV at knee             yes    >500 ms      0.23              +--------------+---------+------+-----------+------------+--------+  GSV prox  calf no                            0.26              +--------------+---------+------+-----------+------------+--------+  SSV Pop Fossa no                            0.21              +--------------+---------+------+-----------+------------+--------+  SSV prox calf no                            0.31              +--------------+---------+------+-----------+------------+--------+   Summary:  Right:  - No evidence of superficial venous reflux seen in the right short saphenous vein.    Left:  - No evidence of deep vein thrombosis seen in the left lower extremity, from the common femoral through the popliteal veins.    - No evidence of superficial venous thrombosis in the left lower extremity.    - Venous reflux is noted in the left sapheno-femoral junction.  - Venous reflux is noted in the left greater saphenous vein in the thigh.  - Venous reflux is noted in the left greater saphenous vein in the calf.     Kendra Fowler is a 42 y.o. female who presents with: BLE swelling with left worse than right.     -pt has easily palpable pedal pulses bilaterally -pt does not have evidence of DVT.  Pt does have venous reflux at the [facility] and the GSV in the proximal, mid, distal thigh as well as at the knee.  The vein measures less than 4mm throughout except at the proximal thigh.   -discussed with pt about wearing knee high 15-20 mmHg compression stockings and pt was measured for these today.    -discussed the importance of leg elevation and how to elevate properly - pt is advised to elevate their legs and a diagram is given to them to demonstrate for pt to lay flat on their back with knees elevated and slightly bent with their feet higher than their knees, which puts their feet higher than their heart for 15 minutes per day.  If pt cannot lay flat, advised to lay as flat as possible.  -pt is advised to continue to stay active.  She does quite a bit of sitting for  work-discussed trying to walk throughout the day. -discussed importance of weight loss and exercise and that water aerobics would also be beneficial.  She is currently taking Zepbound and has lost 20lbs.  Encouraged her to continue to work on her weight loss and we discussed logging food, weighing and measuring food.  -handout with recommendations given  She does have a ruptured baker's cyst on the left leg.  She is getting an MRI in the near future.  She will try conservative measures with the above recommendations.  If she does not get any relief with the left leg swelling, discussed we could get a CT venogram to evaluate for May Thurner.  She is in agreement  with this plan and will call if she would like to proceed with this.    Doreatha Massed, [facility] [phone]  Clinic MD:  Lenell Antu on call MD

## 2022-09-23 ENCOUNTER — Ambulatory Visit (HOSPITAL_COMMUNITY)
Admission: RE | Admit: 2022-09-23 | Discharge: 2022-09-23 | Disposition: A | Discharge status: Home / Self Care | Payer: Medicaid Other | Source: Ambulatory Visit | Attending: Vascular Surgery | Admitting: Vascular Surgery

## 2022-09-23 ENCOUNTER — Ambulatory Visit (INDEPENDENT_AMBULATORY_CARE_PROVIDER_SITE_OTHER): Payer: Medicaid Other | Admitting: Physician Assistant

## 2022-09-23 VITALS — BP 154/99 | HR 74 | Temp 98.0°F | Resp 20 | Ht 67.0 in | Wt 315.0 lb

## 2022-09-23 DIAGNOSIS — M7989 Other specified soft tissue disorders: Secondary | ICD-10-CM

## 2022-10-01 ENCOUNTER — Other Ambulatory Visit (HOSPITAL_BASED_OUTPATIENT_CLINIC_OR_DEPARTMENT_OTHER): Payer: Self-pay

## 2022-10-27 ENCOUNTER — Other Ambulatory Visit (HOSPITAL_BASED_OUTPATIENT_CLINIC_OR_DEPARTMENT_OTHER): Payer: Self-pay

## 2022-10-27 MED ORDER — ZEPBOUND 10 MG/0.5ML ~~LOC~~ SOAJ
10.0000 mg | SUBCUTANEOUS | 0 refills | Status: DC
  Filled 2022-10-27 – 2022-11-10 (×3): qty 2, 28d supply, fill #0

## 2022-11-05 ENCOUNTER — Other Ambulatory Visit (HOSPITAL_BASED_OUTPATIENT_CLINIC_OR_DEPARTMENT_OTHER): Payer: Self-pay

## 2022-11-05 MED ORDER — FLUCONAZOLE 150 MG PO TABS
150.0000 mg | ORAL_TABLET | Freq: Once | ORAL | 0 refills | Status: AC
  Filled 2022-11-05: qty 2, 3d supply, fill #0

## 2022-11-09 ENCOUNTER — Encounter (HOSPITAL_COMMUNITY): Payer: Self-pay

## 2022-11-10 ENCOUNTER — Other Ambulatory Visit (HOSPITAL_BASED_OUTPATIENT_CLINIC_OR_DEPARTMENT_OTHER): Payer: Self-pay

## 2022-11-11 ENCOUNTER — Other Ambulatory Visit (HOSPITAL_BASED_OUTPATIENT_CLINIC_OR_DEPARTMENT_OTHER): Payer: Self-pay

## 2022-11-12 ENCOUNTER — Other Ambulatory Visit (HOSPITAL_BASED_OUTPATIENT_CLINIC_OR_DEPARTMENT_OTHER): Payer: Self-pay

## 2022-11-15 ENCOUNTER — Other Ambulatory Visit (HOSPITAL_BASED_OUTPATIENT_CLINIC_OR_DEPARTMENT_OTHER): Payer: Self-pay

## 2022-11-16 ENCOUNTER — Other Ambulatory Visit (HOSPITAL_BASED_OUTPATIENT_CLINIC_OR_DEPARTMENT_OTHER): Payer: Self-pay

## 2022-11-19 ENCOUNTER — Other Ambulatory Visit (HOSPITAL_BASED_OUTPATIENT_CLINIC_OR_DEPARTMENT_OTHER): Payer: Self-pay

## 2022-11-22 ENCOUNTER — Other Ambulatory Visit (HOSPITAL_BASED_OUTPATIENT_CLINIC_OR_DEPARTMENT_OTHER): Payer: Self-pay

## 2022-11-22 NOTE — Progress Notes (Signed)
COVID Vaccine Completed:  Date of COVID positive in last 90 days:  PCP - Darryll Capers, PA Cardiologist -   Chest x-ray -  EKG -  Stress Test -  ECHO -  Cardiac Cath -  Pacemaker/ICD device last checked: Spinal Cord Stimulator:  Bowel Prep -   Sleep Study -  CPAP -   Fasting Blood Sugar -  Checks Blood Sugar _____ times a day  Last dose of GLP1 agonist-  Zepbound GLP1 instructions:     Last dose of SGLT-2 inhibitors-  N/A SGLT-2 instructions: N/A   Blood Thinner Instructions:  Time Aspirin Instructions: Last Dose:  Activity level:  Can go up a flight of stairs and perform activities of daily living without stopping and without symptoms of chest pain or shortness of breath.  Able to exercise without symptoms  Unable to go up a flight of stairs without symptoms of     Anesthesia review:   Patient denies shortness of breath, fever, cough and chest pain at PAT appointment  Patient verbalized understanding of instructions that were given to them at the PAT appointment. Patient was also instructed that they will need to review over the PAT instructions again at home before surgery.

## 2022-11-22 NOTE — Patient Instructions (Addendum)
SURGICAL WAITING ROOM VISITATION  Patients having surgery or a procedure may have no more than 2 support people in the waiting area - these visitors may rotate.    Children under the age of 62 must have an adult with them who is not the patient.  Due to an increase in RSV and influenza rates and associated hospitalizations, children ages 71 and under may not visit patients in [facility] hospitals.  If the patient needs to stay at the hospital during part of their recovery, the visitor guidelines for inpatient rooms apply. Pre-op nurse will coordinate an appropriate time for 1 support person to accompany patient in pre-op.  This support person may not rotate.    Please refer to the [facility] website for the visitor guidelines for Inpatients (after your surgery is over and you are in a regular room).    Your procedure is scheduled on: Friday   12/17/22   Report to [facility] Main Entrance    Report to admitting at 7:00 AM   Call this number if you have problems the morning of surgery [phone]   Do not eat food :After Midnight.   After Midnight you may have the following liquids until 6:15 AM DAY OF SURGERY  Water Non-Citrus Juices (without pulp, NO RED-Apple, White grape, White cranberry) Black Coffee (NO MILK/CREAM OR CREAMERS, sugar ok)  Clear Tea (NO MILK/CREAM OR CREAMERS, sugar ok) regular and decaf                             Plain Jell-O (NO RED)                                           Fruit ices (not with fruit pulp, NO RED)                                     Popsicles (NO RED)                                                               Sports drinks like Gatorade (NO RED)               The day of surgery:  Drink ONE (1) Pre-Surgery Clear Ensure at 6:15 AM the morning of surgery. Drink in one sitting. Do not sip.  This drink was given to you during your hospital  pre-op appointment visit. Nothing else to drink after completing the  Pre-Surgery Clear  Ensure          If you have questions, please contact your surgeon's office.   FOLLOW ANY ADDITIONAL PRE OP INSTRUCTIONS YOU RECEIVED FROM YOUR SURGEON'S OFFICE!!!     Oral Hygiene is also important to reduce your risk of infection.                                    Remember - BRUSH YOUR TEETH THE MORNING OF SURGERY WITH YOUR REGULAR TOOTHPASTE  DENTURES WILL BE REMOVED  PRIOR TO SURGERY PLEASE DO NOT APPLY "Poly grip" OR ADHESIVES!!!   Do NOT smoke after Midnight   Take these medicines the morning of surgery with A SIP OF WATER: Inhalers, Pantoprazole, Zofran, Prednisone   ?????   Bring CPAP mask and tubing day of surgery.                              You may not have any metal on your body including hair pins, jewelry, and body piercing             Do not wear make-up, lotions, powders, perfumes, or deodorant  Do not wear nail polish including gel and S&S, artificial/acrylic nails, or any other type of covering on natural nails including finger and toenails. If you have artificial nails, gel coating, etc. that needs to be removed by a nail salon please have this removed prior to surgery or surgery may need to be canceled/ delayed if the surgeon/ anesthesia feels like they are unable to be safely monitored.   Do not shave  48 hours prior to surgery.    Do not bring valuables to the hospital. [facility] IS NOT             RESPONSIBLE   FOR VALUABLES.   Contacts, glasses, dentures or bridgework may not be worn into surgery.  DO NOT BRING YOUR HOME MEDICATIONS TO THE HOSPITAL. PHARMACY WILL DISPENSE MEDICATIONS LISTED ON YOUR MEDICATION LIST TO YOU DURING YOUR ADMISSION IN THE HOSPITAL!    Patients discharged on the day of surgery will not be allowed to drive home.  Someone NEEDS to stay with you for the first 24 hours after anesthesia.                Please read over the following fact sheets you were given: IF YOU HAVE QUESTIONS ABOUT YOUR PRE-OP INSTRUCTIONS PLEASE CALL  [phone]- [facility] - Preparing for Surgery Before surgery, you can play an important role.  Because skin is not sterile, your skin needs to be as free of germs as possible.  You can reduce the number of germs on your skin by washing with CHG (chlorahexidine gluconate) soap before surgery.  CHG is an antiseptic cleaner which kills germs and bonds with the skin to continue killing germs even after washing. Please DO NOT use if you have an allergy to CHG or antibacterial soaps.  If your skin becomes reddened/irritated stop using the CHG and inform your nurse when you arrive at Short Stay. Do not shave (including legs and underarms) for at least 48 hours prior to the first CHG shower.  You may shave your face/neck.  Please follow these instructions carefully:  1.  Shower with CHG Soap the night before surgery and the  morning of surgery.  2.  If you choose to wash your hair, wash your hair first as usual with your normal  shampoo.  3.  After you shampoo, rinse your hair and body thoroughly to remove the shampoo.                             4.  Use CHG as you would any other liquid soap.  You can apply chg directly to the skin and wash.  Gently with a scrungie or clean washcloth.  5.  Apply the CHG Soap to your body ONLY FROM  THE NECK DOWN.   Do   not use on face/ open                           Wound or open sores. Avoid contact with eyes, ears mouth and   genitals (private parts).                       Wash face,  Genitals (private parts) with your normal soap.             6.  Wash thoroughly, paying special attention to the area where your    surgery  will be performed.  7.  Thoroughly rinse your body with warm water from the neck down.  8.  DO NOT shower/wash with your normal soap after using and rinsing off the CHG Soap.                9.  Pat yourself dry with a clean towel.            10.  Wear clean pajamas.            11.  Place clean sheets on your bed the night of your  first shower and do not  sleep with pets. Day of Surgery : Do not apply any lotions/deodorants the morning of surgery.  Please wear clean clothes to the hospital/surgery center.  FAILURE TO FOLLOW THESE INSTRUCTIONS MAY RESULT IN THE CANCELLATION OF YOUR SURGERY  PATIENT SIGNATURE_________________________________  NURSE SIGNATURE__________________________________  ________________________________________________________________________        Rogelia Mire  An incentive spirometer is a tool that can help keep your lungs clear and active. This tool measures how well you are filling your lungs with each breath. Taking long deep breaths may help reverse or decrease the chance of developing breathing (pulmonary) problems (especially infection) following: A long period of time when you are unable to move or be active. BEFORE THE PROCEDURE  If the spirometer includes an indicator to show your best effort, your nurse or respiratory therapist will set it to a desired goal. If possible, sit up straight or lean slightly forward. Try not to slouch. Hold the incentive spirometer in an upright position. INSTRUCTIONS FOR USE  Sit on the edge of your bed if possible, or sit up as far as you can in bed or on a chair. Hold the incentive spirometer in an upright position. Breathe out normally. Place the mouthpiece in your mouth and seal your lips tightly around it. Breathe in slowly and as deeply as possible, raising the piston or the ball toward the top of the column. Hold your breath for 3-5 seconds or for as long as possible. Allow the piston or ball to fall to the bottom of the column. Remove the mouthpiece from your mouth and breathe out normally. Rest for a few seconds and repeat Steps 1 through 7 at least 10 times every 1-2 hours when you are awake. Take your time and take a few normal breaths between deep breaths. The spirometer may include an indicator to show your best effort. Use  the indicator as a goal to work toward during each repetition. After each set of 10 deep breaths, practice coughing to be sure your lungs are clear. If you have an incision (the cut made at the time of surgery), support your incision when coughing by placing a pillow or rolled up towels firmly against it. Once you are able  to get out of bed, walk around indoors and cough well. You may stop using the incentive spirometer when instructed by your caregiver.  RISKS AND COMPLICATIONS Take your time so you do not get dizzy or light-headed. If you are in pain, you may need to take or ask for pain medication before doing incentive spirometry. It is harder to take a deep breath if you are having pain. AFTER USE Rest and breathe slowly and easily. It can be helpful to keep track of a log of your progress. Your caregiver can provide you with a simple table to help with this. If you are using the spirometer at home, follow these instructions: [place] IF:  You are having difficultly using the spirometer. You have trouble using the spirometer as often as instructed. Your pain medication is not giving enough relief while using the spirometer. You develop fever of 100.5 F (38.1 C) or higher. SEEK IMMEDIATE MEDICAL CARE IF:  You cough up bloody sputum that had not been present before. You develop fever of 102 F (38.9 C) or greater. You develop worsening pain at or near the incision site. MAKE SURE YOU:  Understand these instructions. Will watch your condition. Will get help right away if you are not doing well or get worse. Document Released: 10/04/2006 Document Revised: 08/16/2011 Document Reviewed: 12/05/2006 [facility] Patient Information 2014 [place], [place].   ________________________________________________________________________

## 2022-11-28 NOTE — Progress Notes (Signed)
COVID Vaccine received:  []  No [x]  Yes Date of any COVID positive Test in last 90 days:  None  PCP - Dr. Suzzanne Cloud at [facility]  Cardiologist - Rosita Kea, MD at [facility]  Endocrinology- Osie Bond, MD at [facility]  Chest x-ray - 01-09-2021  1v  Epic EKG -  (01-13-2021 epic)  Fax copy coming from [facility]  Stress Test -  ECHO - done at [facility]   06-08-22 Cardiac Cath -   PCR screen: []  Ordered & Completed           []   No Order but Needs PROFEND           [x]   N/A for this surgery  Surgery Plan:  [x]  Ambulatory                            []  Outpatient in bed                            []  Admit  Anesthesia:    []  General  []  Spinal                           [x]   Choice []   MAC  Pacemaker / ICD device [x]  No []  Yes   Spinal Cord Stimulator:[x]  No []  Yes       History of Sleep Apnea? [x]  No []  Yes   CPAP used?- [x]  No []  Yes    Does the patient monitor blood sugar?          [x]  No []  Yes  []  N/A  Patient has: [x]  NO Hx DM   []  Pre-DM                 []  DM1  []   DM2  GLP1 agonist / usual dose - Zepbound  injections on Friday, For weight loss only GLP1 instructions: hold starting on Friday 12-03-2022,  patient is aware  Blood Thinner / Instructions:  none Aspirin Instructions:  none  ERAS Protocol Ordered: []  No  [x]  Yes PRE-SURGERY [x]  ENSURE  []  G2  Patient is to be NPO after: 06:15 am  Comments: Pharmacy Tech, Otilio Carpen, did not come to PST and do Med. Recon. I staff messaged her x2, with no response. I went ahead and marked all medicine that the Patient currently takes. Her Pharmacy did not change  Activity level: Patient is unable to climb a flight of stairs without difficulty; [x]  No CP  [x]  No SOB, but would have severe leg pain, Patient can perform ADLs without assistance.   Anesthesia review: Palps- had ECHO, workup- related to PCOS,   Patient denies shortness of breath, fever, cough and chest pain  at PAT appointment.  Patient verbalized understanding and agreement to the Pre-Surgical Instructions that were given to them at this PAT appointment. Patient was also educated of the need to review these PAT instructions again prior to her surgery.I reviewed the appropriate phone numbers to call if they have any and questions or concerns.

## 2022-11-30 ENCOUNTER — Other Ambulatory Visit: Payer: Self-pay

## 2022-11-30 ENCOUNTER — Encounter (HOSPITAL_COMMUNITY): Payer: Self-pay

## 2022-11-30 ENCOUNTER — Encounter (HOSPITAL_COMMUNITY)
Admission: RE | Admit: 2022-11-30 | Discharge: 2022-11-30 | Disposition: A | Discharge status: Home / Self Care | Payer: BC Managed Care – PPO | Source: Ambulatory Visit | Attending: Orthopedic Surgery | Admitting: Orthopedic Surgery

## 2022-11-30 VITALS — BP 127/76 | HR 78 | Temp 98.6°F | Resp 22 | Ht 67.0 in | Wt 295.0 lb

## 2022-11-30 DIAGNOSIS — Z01812 Encounter for preprocedural laboratory examination: Secondary | ICD-10-CM | POA: Diagnosis present

## 2022-11-30 DIAGNOSIS — Z01818 Encounter for other preprocedural examination: Secondary | ICD-10-CM

## 2022-11-30 HISTORY — DX: Myoneural disorder, unspecified: G70.9

## 2022-11-30 HISTORY — DX: Cardiac arrhythmia, unspecified: I49.9

## 2022-11-30 HISTORY — DX: Anxiety disorder, unspecified: F41.9

## 2022-11-30 HISTORY — DX: Other complications of anesthesia, initial encounter: T88.59XA

## 2022-11-30 HISTORY — DX: Gastro-esophageal reflux disease without esophagitis: K21.9

## 2022-11-30 HISTORY — DX: Anemia, unspecified: D64.9

## 2022-11-30 LAB — CBC
HCT: 37.8 % (ref 36.0–46.0)
Hemoglobin: 11.7 g/dL — ABNORMAL LOW (ref 12.0–15.0)
MCH: 25.6 pg — ABNORMAL LOW (ref 26.0–34.0)
MCHC: 31 g/dL (ref 30.0–36.0)
MCV: 82.7 fL (ref 80.0–100.0)
Platelets: 318 10*3/uL (ref 150–400)
RBC: 4.57 MIL/uL (ref 3.87–5.11)
RDW: 15.8 % — ABNORMAL HIGH (ref 11.5–15.5)
WBC: 7.3 10*3/uL (ref 4.0–10.5)
nRBC: 0 % (ref 0.0–0.2)

## 2022-12-04 ENCOUNTER — Other Ambulatory Visit (HOSPITAL_BASED_OUTPATIENT_CLINIC_OR_DEPARTMENT_OTHER): Payer: Self-pay

## 2022-12-04 MED ORDER — ZEPBOUND 10 MG/0.5ML ~~LOC~~ SOAJ
10.0000 mg | SUBCUTANEOUS | 0 refills | Status: DC
  Filled 2022-12-04 – 2022-12-16 (×5): qty 2, 28d supply, fill #0

## 2022-12-04 MED ORDER — FUROSEMIDE 20 MG PO TABS
20.0000 mg | ORAL_TABLET | Freq: Every day | ORAL | 0 refills | Status: DC
  Filled 2022-12-04: qty 90, 90d supply, fill #0

## 2022-12-06 ENCOUNTER — Other Ambulatory Visit: Payer: Self-pay

## 2022-12-06 ENCOUNTER — Other Ambulatory Visit (HOSPITAL_BASED_OUTPATIENT_CLINIC_OR_DEPARTMENT_OTHER): Payer: Self-pay

## 2022-12-06 MED ORDER — TRAMADOL HCL 50 MG PO TABS
50.0000 mg | ORAL_TABLET | Freq: Three times a day (TID) | ORAL | 0 refills | Status: DC
  Filled 2022-12-06: qty 15, 5d supply, fill #0

## 2022-12-07 ENCOUNTER — Other Ambulatory Visit (HOSPITAL_BASED_OUTPATIENT_CLINIC_OR_DEPARTMENT_OTHER): Payer: Self-pay

## 2022-12-08 ENCOUNTER — Other Ambulatory Visit (HOSPITAL_BASED_OUTPATIENT_CLINIC_OR_DEPARTMENT_OTHER): Payer: Self-pay

## 2022-12-16 ENCOUNTER — Other Ambulatory Visit: Payer: Self-pay

## 2022-12-16 ENCOUNTER — Other Ambulatory Visit (HOSPITAL_BASED_OUTPATIENT_CLINIC_OR_DEPARTMENT_OTHER): Payer: Self-pay

## 2022-12-17 ENCOUNTER — Ambulatory Visit (HOSPITAL_COMMUNITY)
Admission: RE | Admit: 2022-12-17 | Discharge: 2022-12-17 | Disposition: A | Discharge status: Home / Self Care | Payer: BC Managed Care – PPO | Source: Ambulatory Visit | Attending: Orthopedic Surgery | Admitting: Orthopedic Surgery

## 2022-12-17 ENCOUNTER — Other Ambulatory Visit: Payer: Self-pay

## 2022-12-17 ENCOUNTER — Encounter (HOSPITAL_COMMUNITY): Payer: Self-pay | Admitting: Orthopedic Surgery

## 2022-12-17 ENCOUNTER — Encounter (HOSPITAL_COMMUNITY)
Admission: RE | Disposition: A | Discharge status: Home / Self Care | Payer: Self-pay | Source: Ambulatory Visit | Attending: Orthopedic Surgery

## 2022-12-17 ENCOUNTER — Ambulatory Visit (HOSPITAL_COMMUNITY): Payer: BC Managed Care – PPO | Admitting: Certified Registered Nurse Anesthetist

## 2022-12-17 DIAGNOSIS — X58XXXA Exposure to other specified factors, initial encounter: Secondary | ICD-10-CM | POA: Insufficient documentation

## 2022-12-17 DIAGNOSIS — M659 Synovitis and tenosynovitis, unspecified: Secondary | ICD-10-CM | POA: Diagnosis not present

## 2022-12-17 DIAGNOSIS — S83282A Other tear of lateral meniscus, current injury, left knee, initial encounter: Secondary | ICD-10-CM | POA: Diagnosis not present

## 2022-12-17 DIAGNOSIS — S83242A Other tear of medial meniscus, current injury, left knee, initial encounter: Secondary | ICD-10-CM | POA: Diagnosis not present

## 2022-12-17 DIAGNOSIS — M1990 Unspecified osteoarthritis, unspecified site: Secondary | ICD-10-CM | POA: Diagnosis not present

## 2022-12-17 DIAGNOSIS — K219 Gastro-esophageal reflux disease without esophagitis: Secondary | ICD-10-CM | POA: Diagnosis not present

## 2022-12-17 DIAGNOSIS — M2242 Chondromalacia patellae, left knee: Secondary | ICD-10-CM | POA: Insufficient documentation

## 2022-12-17 HISTORY — PX: KNEE ARTHROSCOPY WITH MENISCAL REPAIR: SHX5653

## 2022-12-17 LAB — HCG, SERUM, QUALITATIVE: Preg, Serum: NEGATIVE

## 2022-12-17 SURGERY — ARTHROSCOPY, KNEE, WITH MENISCUS REPAIR
Anesthesia: General | Site: Knee | Laterality: Left

## 2022-12-17 MED ORDER — VANCOMYCIN HCL 2000 MG/400ML IV SOLN
2000.0000 mg | Freq: Once | INTRAVENOUS | Status: AC
  Administered 2022-12-17: 2000 mg via INTRAVENOUS
  Filled 2022-12-17: qty 400

## 2022-12-17 MED ORDER — SODIUM CHLORIDE 0.9 % IR SOLN
Status: DC | PRN
  Administered 2022-12-17 (×2): 3000 mL

## 2022-12-17 MED ORDER — LIDOCAINE 2% (20 MG/ML) 5 ML SYRINGE
INTRAMUSCULAR | Status: DC | PRN
  Administered 2022-12-17: 60 mg via INTRAVENOUS

## 2022-12-17 MED ORDER — ONDANSETRON HCL 4 MG/2ML IJ SOLN
INTRAMUSCULAR | Status: AC
  Filled 2022-12-17: qty 2

## 2022-12-17 MED ORDER — BUPIVACAINE HCL (PF) 0.25 % IJ SOLN
INTRAMUSCULAR | Status: AC
  Filled 2022-12-17: qty 30

## 2022-12-17 MED ORDER — LACTATED RINGERS IV SOLN: INTRAVENOUS | Status: DC

## 2022-12-17 MED ORDER — PROPOFOL 10 MG/ML IV BOLUS
INTRAVENOUS | Status: DC | PRN
Start: 2022-12-17 — End: 2022-12-17
  Administered 2022-12-17: 200 mg via INTRAVENOUS

## 2022-12-17 MED ORDER — MIDAZOLAM HCL 2 MG/2ML IJ SOLN
INTRAMUSCULAR | Status: AC
  Filled 2022-12-17: qty 2

## 2022-12-17 MED ORDER — MIDAZOLAM HCL 2 MG/2ML IJ SOLN
INTRAMUSCULAR | Status: DC | PRN
  Administered 2022-12-17: 2 mg via INTRAVENOUS

## 2022-12-17 MED ORDER — OXYCODONE HCL 5 MG PO TABS
ORAL_TABLET | ORAL | Status: AC
  Filled 2022-12-17: qty 1

## 2022-12-17 MED ORDER — LIDOCAINE HCL (PF) 2 % IJ SOLN
INTRAMUSCULAR | Status: AC
  Filled 2022-12-17: qty 5

## 2022-12-17 MED ORDER — FENTANYL CITRATE (PF) 100 MCG/2ML IJ SOLN
INTRAMUSCULAR | Status: DC | PRN
  Administered 2022-12-17 (×6): 50 ug via INTRAVENOUS

## 2022-12-17 MED ORDER — CHLORHEXIDINE GLUCONATE 0.12 % MT SOLN
15.0000 mL | Freq: Once | OROMUCOSAL | Status: AC
  Administered 2022-12-17: 15 mL via OROMUCOSAL

## 2022-12-17 MED ORDER — OXYCODONE HCL 5 MG PO TABS
5.0000 mg | ORAL_TABLET | Freq: Once | ORAL | Status: AC | PRN
  Administered 2022-12-17: 5 mg via ORAL

## 2022-12-17 MED ORDER — EPINEPHRINE PF 1 MG/ML IJ SOLN
INTRAMUSCULAR | Status: AC
  Filled 2022-12-17: qty 1

## 2022-12-17 MED ORDER — FENTANYL CITRATE (PF) 100 MCG/2ML IJ SOLN
INTRAMUSCULAR | Status: AC
  Filled 2022-12-17: qty 2

## 2022-12-17 MED ORDER — FENTANYL CITRATE PF 50 MCG/ML IJ SOSY: 50.0000 ug | PREFILLED_SYRINGE | INTRAMUSCULAR | Status: DC

## 2022-12-17 MED ORDER — PROPOFOL 10 MG/ML IV BOLUS
INTRAVENOUS | Status: AC
  Filled 2022-12-17: qty 20

## 2022-12-17 MED ORDER — EPINEPHRINE (ANAPHYLAXIS) 30 MG/30ML IJ SOLN
INTRAMUSCULAR | Status: DC | PRN
  Administered 2022-12-17: 1 mg

## 2022-12-17 MED ORDER — ORAL CARE MOUTH RINSE: 15.0000 mL | Freq: Once | OROMUCOSAL | Status: AC

## 2022-12-17 MED ORDER — OXYCODONE HCL 5 MG PO TABS: 5.0000 mg | ORAL_TABLET | ORAL | 0 refills | Status: DC | PRN

## 2022-12-17 MED ORDER — OXYCODONE HCL 5 MG/5ML PO SOLN: 5.0000 mg | Freq: Once | ORAL | Status: AC | PRN

## 2022-12-17 MED ORDER — ONDANSETRON HCL 4 MG/2ML IJ SOLN
INTRAMUSCULAR | Status: AC
  Administered 2022-12-17: 4 mg via INTRAVENOUS
  Filled 2022-12-17: qty 2

## 2022-12-17 MED ORDER — FENTANYL CITRATE PF 50 MCG/ML IJ SOSY
25.0000 ug | PREFILLED_SYRINGE | INTRAMUSCULAR | Status: DC | PRN
  Administered 2022-12-17: 25 ug via INTRAVENOUS
  Administered 2022-12-17: 50 ug via INTRAVENOUS

## 2022-12-17 MED ORDER — ONDANSETRON HCL 4 MG PO TABS: 4.0000 mg | ORAL_TABLET | Freq: Three times a day (TID) | ORAL | 0 refills | Status: DC | PRN

## 2022-12-17 MED ORDER — ONDANSETRON HCL 4 MG/2ML IJ SOLN
INTRAMUSCULAR | Status: DC | PRN
  Administered 2022-12-17: 4 mg via INTRAVENOUS

## 2022-12-17 MED ORDER — DEXAMETHASONE SODIUM PHOSPHATE 10 MG/ML IJ SOLN
INTRAMUSCULAR | Status: AC
  Filled 2022-12-17: qty 1

## 2022-12-17 MED ORDER — ONDANSETRON HCL 4 MG/2ML IJ SOLN: 4.0000 mg | Freq: Four times a day (QID) | INTRAMUSCULAR | Status: AC | PRN

## 2022-12-17 MED ORDER — BUPIVACAINE HCL 0.25 % IJ SOLN
INTRAMUSCULAR | Status: DC | PRN
  Administered 2022-12-17: 20 mL

## 2022-12-17 MED ORDER — DEXAMETHASONE SODIUM PHOSPHATE 4 MG/ML IJ SOLN
INTRAMUSCULAR | Status: DC | PRN
  Administered 2022-12-17: 5 mg via INTRAVENOUS

## 2022-12-17 MED ORDER — MIDAZOLAM HCL 2 MG/2ML IJ SOLN: 1.0000 mg | INTRAMUSCULAR | Status: DC

## 2022-12-17 MED ORDER — FENTANYL CITRATE PF 50 MCG/ML IJ SOSY
PREFILLED_SYRINGE | INTRAMUSCULAR | Status: AC
  Administered 2022-12-17: 25 ug via INTRAVENOUS
  Filled 2022-12-17: qty 3

## 2022-12-17 SURGICAL SUPPLY — 44 items
BANDAGE ESMARK 6X9 LF (GAUZE/BANDAGES/DRESSINGS) IMPLANT
BLADE SHAVER TORPEDO 4X13 (MISCELLANEOUS) IMPLANT
BNDG CMPR 5X62 HK CLSR LF (GAUZE/BANDAGES/DRESSINGS) ×1
BNDG CMPR 6 X 5 YARDS HK CLSR (GAUZE/BANDAGES/DRESSINGS) ×1
BNDG CMPR 6"X 5 YARDS HK CLSR (GAUZE/BANDAGES/DRESSINGS) ×1
BNDG CMPR 9X6 STRL LF SNTH (GAUZE/BANDAGES/DRESSINGS)
BNDG ELASTIC 6INX 5YD STR LF (GAUZE/BANDAGES/DRESSINGS) ×1 IMPLANT
BNDG ESMARK 6X9 LF (GAUZE/BANDAGES/DRESSINGS)
CUFF TOURN SGL QUICK 34 (TOURNIQUET CUFF)
CUFF TOURN SGL QUICK 42 (TOURNIQUET CUFF) IMPLANT
CUFF TRNQT CYL 34X4.125X (TOURNIQUET CUFF) IMPLANT
DRAPE ARTHROSCOPY W/POUCH 114 (DRAPES) ×1 IMPLANT
DRAPE U-SHAPE 47X51 STRL (DRAPES) ×1 IMPLANT
DURAPREP 26ML APPLICATOR (WOUND CARE) ×1 IMPLANT
GAUZE 4X4 16PLY ~~LOC~~+RFID DBL (SPONGE) ×1 IMPLANT
GAUZE PAD ABD 8X10 STRL (GAUZE/BANDAGES/DRESSINGS) ×1 IMPLANT
GAUZE SPONGE 4X4 12PLY STRL (GAUZE/BANDAGES/DRESSINGS) ×1 IMPLANT
GAUZE XEROFORM 1X8 LF (GAUZE/BANDAGES/DRESSINGS) IMPLANT
GLOVE BIO SURGEON STRL SZ7.5 (GLOVE) ×2 IMPLANT
GLOVE BIOGEL PI IND STRL 8 (GLOVE) ×2 IMPLANT
GOWN STRL REUS W/ TWL XL LVL3 (GOWN DISPOSABLE) ×2 IMPLANT
GOWN STRL REUS W/TWL XL LVL3 (GOWN DISPOSABLE) ×2
IMMOBILIZER KNEE 22 UNIV (SOFTGOODS) IMPLANT
IV NS IRRIG 3000ML ARTHROMATIC (IV SOLUTION) ×2 IMPLANT
KIT BASIN OR (CUSTOM PROCEDURE TRAY) ×1 IMPLANT
KIT SUTLOC MENISCAL ROOT REP (Anchor) IMPLANT
KIT TURNOVER KIT A (KITS) ×1 IMPLANT
LASSO CRESCENT QUICKPASS (SUTURE) IMPLANT
MANIFOLD NEPTUNE II (INSTRUMENTS) ×1 IMPLANT
PACK ARTHROSCOPY DSU (CUSTOM PROCEDURE TRAY) ×1 IMPLANT
PAD ARMBOARD 7.5X6 YLW CONV (MISCELLANEOUS) IMPLANT
PADDING CAST COTTON 6X4 STRL (CAST SUPPLIES) IMPLANT
PORT APPOLLO RF 90DEGREE MULTI (SURGICAL WAND) IMPLANT
STRIP CLOSURE SKIN 1/2X4 (GAUZE/BANDAGES/DRESSINGS) IMPLANT
SUT ETHILON 3 0 PS 1 (SUTURE) IMPLANT
SUT MNCRL AB 3-0 PS2 18 (SUTURE) IMPLANT
SUT MNCRL AB 3-0 PS2 27 (SUTURE) IMPLANT
SUTURE TAPE TIGERLINK 1.3MM BL (SUTURE) IMPLANT
SUTURETAPE TIGERLINK 1.3MM BL (SUTURE) ×1
TOWEL OR 17X26 10 PK STRL BLUE (TOWEL DISPOSABLE) ×1 IMPLANT
TUBING ARTHROSCOPY IRRIG 16FT (MISCELLANEOUS) ×1 IMPLANT
TUBING CONNECTING 10 (TUBING) ×1 IMPLANT
WAND APOLLORF SJ50 AR-9845 (SURGICAL WAND) IMPLANT
WATER STERILE IRR 500ML POUR (IV SOLUTION) ×1 IMPLANT

## 2022-12-17 NOTE — Transfer of Care (Signed)
Immediate Anesthesia Transfer of Care Note  Patient: Kendra Fowler  Procedure(s) Performed: KNEE ARTHROSCOPY, PARTIAL LATERAL MENISCECTOMY AND MEDIAL MENISCUS REPAIR (Left: Knee)  Patient Location: PACU  Anesthesia Type:General  Level of Consciousness: awake and patient cooperative  Airway & Oxygen Therapy: Patient Spontanous Breathing and Patient connected to face mask  Post-op Assessment: Report given to RN and Post -op Vital signs reviewed and stable  Post vital signs: Reviewed and stable  Last Vitals:  Vitals Value Taken Time  BP 134/81 12/17/22 1008  Temp    Pulse 75 12/17/22 1011  Resp 15 12/17/22 1011  SpO2 100 % 12/17/22 1011  Vitals shown include unfiled device data.  Last Pain:  Vitals:   12/17/22 0816  TempSrc:   PainSc: 0-No pain         Complications: No notable events documented.

## 2022-12-17 NOTE — Anesthesia Preprocedure Evaluation (Signed)
Anesthesia Evaluation  Patient identified by MRN, date of birth, ID band Patient awake    Reviewed: Allergy & Precautions, H&P , NPO status , Patient's Chart, lab work & pertinent test results  Airway Mallampati: II   Neck ROM: full    Dental   Pulmonary neg pulmonary ROS   breath sounds clear to auscultation       Cardiovascular negative cardio ROS  Rhythm:regular Rate:Normal     Neuro/Psych  PSYCHIATRIC DISORDERS Anxiety        GI/Hepatic ,GERD  ,,  Endo/Other    Renal/GU      Musculoskeletal  (+) Arthritis ,    Abdominal   Peds  Hematology   Anesthesia Other Findings   Reproductive/Obstetrics                             Anesthesia Physical Anesthesia Plan  ASA: 2  Anesthesia Plan: General   Post-op Pain Management:    Induction: Intravenous  PONV Risk Score and Plan: 3 and Ondansetron, Dexamethasone, Midazolam and Treatment may vary due to age or medical condition  Airway Management Planned: LMA  Additional Equipment:   Intra-op Plan:   Post-operative Plan: Extubation in OR  Informed Consent: I have reviewed the patients History and Physical, chart, labs and discussed the procedure including the risks, benefits and alternatives for the proposed anesthesia with the patient or authorized representative who has indicated his/her understanding and acceptance.     Dental advisory given  Plan Discussed with: CRNA, Anesthesiologist and Surgeon  Anesthesia Plan Comments:        Anesthesia Quick Evaluation

## 2022-12-17 NOTE — Discharge Instructions (Addendum)
Post-operative patient instructions  Knee Arthroscopy with meniscus repair  Ice:  Place intermittent ice or cooler pack over your knee, 30 minutes on and 30 minutes off.  Continue this for the first 72 hours after surgery, then save ice for use after therapy sessions or on more active days.   Weight:  You may be up to 50% weight on your leg as your symptoms allow. DVT prevention: Perform ankle pumps as able throughout the day on the operative extremity.  Be mobile as possible with ambulation as able.  You should also take an 81 mg aspirin twice per day x6 weeks. Crutches:  Use crutches (or walker) to assist in walking until told to discontinue by your physical therapist or physician. This will help to reduce pain. Strengthening:  Perform simple thigh squeezes (isometric quad contractions) and straight leg lifts as you are able (3 sets of 5 to 10 repetitions, 3 times a day).  For the leg lifts, have someone support under your ankle in the beginning until you have increased strength enough to do this on your own.  To help get started on thigh squeezes, place a pillow under your knee and push down on the pillow with back of knee (sometimes easier to do than with your leg fully straight). Dressing:  Perform 1st dressing change at 3 days postoperative. A moderate amount of blood tinged drainage is to be expected.  So if you bleed through the dressing on the first or second day or if you have fevers, it is fine to change the dressing/check the wounds early and redress wound. Elevate your leg.  If it bleeds through again, or if the incisions are leaking frank blood, please call the office. May change dressing every 1-2 days thereafter to help watch wounds. Can purchase Tegderm (or 35M Nexcare) water resistant dressings at local pharmacy / [employer]. Shower: shower is ok after 3 days.  Please take shower, NO bath. Recover with gauze and ace wrap to help keep wounds protected.   Pain medication:  A narcotic pain  medication has been prescribed.  Take as directed.  Typically you need narcotic pain medication more regularly during the first 3 to 5 days after surgery.  Decrease your use of the medication as the pain improves.  Narcotics can sometimes cause constipation, even after a few doses.  If you have problems with constipation, you can take an over the counter stool softener or light laxative.  If you have persistent problems, please notify your physician's office. Physical therapy: Additional activity guidelines to be provided by your physician or physical therapist at follow-up visits.  Driving: Do not recommend driving x 1-2 weeks post surgical, especially if surgery performed on right side. Should not drive while taking narcotic pain medications. It typically takes at least 2 weeks to restore sufficient neuromuscular function for normal reaction times for driving safety.  Call [phone] for questions or problems. Evenings you will be forwarded to the hospital operator.  Ask for the orthopaedic physician on call. Please call if you experience:    Redness, foul smelling, or persistent drainage from the surgical site  worsening knee pain and swelling not responsive to medication  any calf pain and or swelling of the lower leg  temperatures greater than 101.5 F other questions or concerns   Thank you for allowing [place] to be a part of your care

## 2022-12-17 NOTE — H&P (Signed)
ORTHOPAEDIC H and P  REQUESTING PHYSICIAN: Yolonda Kida, MD  PCP:  Jonna Clark, MD  Chief Complaint: Left knee meniscus root tear, medial side.  HPI: Kendra Fowler is a 42 y.o. female who complains of worsening pain and dysfunction of the left knee due to a root tear of the medial meniscus.  Today for arthroscopic assisted repair.  Past Medical History:  Diagnosis Date   Anemia    Anxiety    Arthritis    Complication of anesthesia    took a long time waking up after her cholecystectomy   Dysrhythmia    Palpitations   Gallbladder attack    GERD (gastroesophageal reflux disease)    Neuromuscular disorder (HCC)    nerve pain down Left leg,   Ovarian cyst    PCOS (polycystic ovarian syndrome)    Thyroid disease    Thyroid nodules,  monitored by endocrinology   Past Surgical History:  Procedure Laterality Date   CHOLECYSTECTOMY     WISDOM TOOTH EXTRACTION     Social History   Socioeconomic History   Marital status: Married    Spouse name: Not on file   Number of children: Not on file   Years of education: Not on file   Highest education level: Not on file  Occupational History   Not on file  Tobacco Use   Smoking status: Never   Smokeless tobacco: Never  Vaping Use   Vaping status: Never Used  Substance and Sexual Activity   Alcohol use: No   Drug use: No   Sexual activity: Yes    Birth control/protection: Pill  Other Topics Concern   Not on file  Social History Narrative   Not on file   Social Determinants of Health   Financial Resource Strain: Not on file  Food Insecurity: Not on file  Transportation Needs: Not on file  Physical Activity: Not on file  Stress: Not on file  Social Connections: Not on file   Family History  Problem Relation Age of Onset   Breast cancer Other        Aunt and Grandmother   Allergies  Allergen Reactions   Lobster [Shellfish Allergy] Itching, Nausea And Vomiting and Swelling    Also,  crab   Penicillins Anaphylaxis   Prior to Admission medications   Medication Sig Start Date End Date Taking? Authorizing Provider  albuterol (VENTOLIN HFA) 108 (90 Base) MCG/ACT inhaler Inhale 1-2 puffs into the lungs every 6 (six) hours as needed for wheezing or shortness of breath. 01/22/20   Nira Conn, MD  aspirin-acetaminophen-caffeine (EXCEDRIN MIGRAINE) [phone] MG tablet Take by mouth every 6 (six) hours as needed for headache.    [provider]  diclofenac Sodium (VOLTAREN) 1 % GEL Apply 2 g topically 4 (four) times daily. Apply to the left knee 08/28/22   Gwyneth Sprout, MD  fluconazole (DIFLUCAN) 150 MG tablet Take 150 mg by mouth daily.    [provider]  furosemide (LASIX) 20 MG tablet Take 20 mg by mouth as needed for edema.    [provider]  furosemide (LASIX) 20 MG tablet Take 1 tablet (20 mg total) by mouth daily. 12/04/22     hydrochlorothiazide (HYDRODIURIL) 25 MG tablet TK 1 T PO QD 11/10/14   [provider]  ibuprofen (ADVIL) 800 MG tablet Take 800 mg by mouth every 8 (eight) hours as needed.    [provider]  metoprolol tartrate (LOPRESSOR) 25 MG tablet Take 12.5  mg by mouth 2 (two) times daily.    [provider]  Multiple Vitamins-Minerals (MULTIVITAMINS THER. W/MINERALS) TABS Take 1 tablet by mouth daily.      [provider]  naproxen (NAPROSYN) 500 MG tablet Take 1 tablet (500 mg total) by mouth 2 (two) times daily. 08/28/22   Gwyneth Sprout, MD  Norethindrone Acetate-Ethinyl Estrad-FE (LOESTRIN 24 FE) 1-20 MG-MCG(24) tablet Take by mouth. 01/27/18   [provider]  ondansetron (ZOFRAN ODT) 8 MG disintegrating tablet Take 1 tablet (8 mg total) by mouth every 8 (eight) hours as needed for nausea or vomiting. 02/17/20   Molpus, Jonny Ruiz, MD  pantoprazole (PROTONIX) 20 MG tablet Take 1 tablet (20 mg total) by mouth daily for 14 days. 01/09/21 01/23/21  Curatolo, Adam, DO  predniSONE  (DELTASONE) 20 MG tablet Take 2 tablets (40 mg total) by mouth daily. 08/28/22   Gwyneth Sprout, MD  spironolactone (ALDACTONE) 25 MG tablet Take 25 mg by mouth daily.    [provider]  tirzepatide (ZEPBOUND) 10 MG/0.5ML Pen Inject 10 mg into the skin once a week. 10/27/22     tirzepatide (ZEPBOUND) 10 MG/0.5ML Pen Inject 10 mg into the skin once a week. 12/04/22     tirzepatide (ZEPBOUND) 7.5 MG/0.5ML Pen Inject 7.5 mg into the skin once a week. 09/21/22     traMADol (ULTRAM) 50 MG tablet Take 50 mg by mouth 3 (three) times daily.    [provider]  traMADol (ULTRAM) 50 MG tablet Take 1 tablet (50 mg total) by mouth 3 (three) times daily.Don't drive while taking this medication 12/04/22     Vitamin D, Ergocalciferol, (DRISDOL) 1.25 MG (50000 UNIT) CAPS capsule Take 50,000 Units by mouth every 7 (seven) days. 11/16/22   [provider]   No results found.  Positive ROS: All other systems have been reviewed and were otherwise negative with the exception of those mentioned in the HPI and as above.  Physical Exam: General: Alert, no acute distress Cardiovascular: No pedal edema Respiratory: No cyanosis, no use of accessory musculature GI: No organomegaly, abdomen is soft and non-tender Skin: No lesions in the area of chief complaint Neurologic: Sensation intact distally Psychiatric: Patient is competent for consent with normal mood and affect Lymphatic: No axillary or cervical lymphadenopathy  MUSCULOSKELETAL: Left knee exam demonstrates no open wounds or lesions.  Mild effusion.  She is neurovascular intact.  Assessment: Left knee medial meniscus root tear  Plan: Plan to proceed today with arthroscopic assisted medial meniscus root repair.  We discussed risk benefits again of the procedure in detail.  This include but not limited to bleeding, infection, damage to surrounding nerves and vessels, stiffness, failure of repair, development of arthritis, risk of DVT  as well as the risk of anesthesia.  He has provided informed consent.  Plan for discharge home postoperatively from PACU.    Yolonda Kida, MD Cell [phone]    12/17/2022 7:19 AM

## 2022-12-17 NOTE — Brief Op Note (Signed)
12/17/2022  10:07 AM  PATIENT:  Kendra Fowler  42 y.o. female  PRE-OPERATIVE DIAGNOSIS:  Left knee medial root tear  POST-OPERATIVE DIAGNOSIS: 1.  Left knee medial meniscus posterior root tear 2.  Left knee with mid body horizontal lateral meniscus tear, initial encounter.  PROCEDURE:  Procedure(s): KNEE ARTHROSCOPY, PARTIAL LATERAL MENISCECTOMY AND MEDIAL MENISCUS REPAIR (Left)  SURGEON:  Surgeons and Role:    * Yolonda Kida, MD - Primary  PHYSICIAN ASSISTANT: Dion Saucier, PA-C   ANESTHESIA:   local and general  EBL:  25 mL   BLOOD ADMINISTERED:none  DRAINS: none   LOCAL MEDICATIONS USED:  MARCAINE     SPECIMEN:  No Specimen  DISPOSITION OF SPECIMEN:  N/A  COUNTS:  YES  TOURNIQUET:   Total Tourniquet Time Documented: Thigh (Left) - 37 minutes Total: Thigh (Left) - 37 minutes   DICTATION: .Reubin Milan Dictation  PLAN OF CARE: Discharge to home after PACU  PATIENT DISPOSITION:  PACU - hemodynamically stable.   Delay start of Pharmacological VTE agent (>24hrs) due to surgical blood loss or risk of bleeding: not applicable

## 2022-12-17 NOTE — Op Note (Signed)
Surgery Date: 12/17/2022  Surgeon(s): Yolonda Kida, MD   Assistant:  Dion Saucier, PA-C   Assistant attestation:  PA McClung present for the entire procedure.  ANESTHESIA:  general, and Local  FLUIDS: Per anesthesia record.   ESTIMATED BLOOD LOSS: minimal  PREOPERATIVE DIAGNOSES:  1.  Left knee posterior horn root tear medial meniscus 2.  Left knee chondromalacia patella  POSTOPERATIVE DIAGNOSES:  1.  Left knee posterior horn root tear medial meniscus 2.  Left knee chondromalacia patella 3.  Left knee horizontal tear mid body lateral meniscus 4.  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  Left knee arthroscopy with limited synovectomy 2.  Left knee arthroscopy with arthroscopic partial lateral meniscectomy 3.  Left knee arthroscopy with arthroscopic chondroplasty posterior surface of patella as well as lateral tibial plateau 4.  Left knee arthroscopic medial meniscus root repair with transosseous tunnel  Implants:  Arthrex suture lock root repair device x 1  DESCRIPTION OF PROCEDURE: Kendra Fowler is a 42 y.o.-year-old female with left knee medial meniscus root posterior horn meniscus tear. Plans are to proceed with partial medial meniscectomy versus repair and diagnostic arthroscopy with debridement as indicated. Full discussion held regarding risks benefits alternatives and complications related surgical intervention. Conservative care options reviewed. All questions answered.  The patient was identified in the preoperative holding area and the operative extremity was marked. The patient was brought to the operating room and transferred to operating table in a supine position. Satisfactory general anesthesia was induced by anesthesiology.    Standard anterolateral, anteromedial arthroscopy portals were obtained. The anteromedial portal was obtained with a spinal needle for localization under direct visualization with  subsequent diagnostic findings.   Anteromedial and anterolateral chambers: mild synovitis. The synovitis was debrided with a 4.5 mm full radius shaver through both the anteromedial and lateral portals.   Suprapatellar pouch and gutters: mild synovitis or debris. Patella chondral surface: Grade 2 Trochlear chondral surface: Grade 1 Patellofemoral tracking: Some lateral tilt mild lateral translation Medial meniscus: Complete radial tear posterior horn at the root attachment with extrusion.  Medial femoral condyle flexion bearing surface: Grade 0 Medial femoral condyle extension bearing surface: Grade 0 Medial tibial plateau: Grade 0 Anterior cruciate ligament:stable Posterior cruciate ligament:stable Lateral meniscus: Horizontal tear mid body in the white zone.   Lateral femoral condyle flexion bearing surface: Grade 0 Lateral femoral condyle extension bearing surface: Grade 0 Lateral tibial plateau: Grade 3  Partial lateral meniscectomy was carried out with motorized shaver.  We removed the unstable flap fragments on the horizontal component superior as well as inferior leaflets back to stable borders.  Chondroplasty was carried out with motorized shaver to debride unstable cartilage flaps from the posterior surface of the patella as well as lateral tibial plateau back to smooth borders.  The medial meniscus was repaired with a transosseous technique.  We utilized the Arthrex suture lock suture device with two 2-0 FiberWire sutures in a knotless fashion.  We first piecrust of the MCL to increase valgus opening of the medial compartment.  This allowed excellent access to the posterior horn of the medial meniscus.  We then introduced the transosseous drill guide and drilled antegrade to the detached position of the root.  We then passed our suture anchor from the medial portal out through the anterior medial drill tunnel.  This was secured in the standard fashion.  We then passed a horizontal  mattress working stitch that was then secured to the knotless  loop and the anchor to bring the meniscus nicely back to bone.  This had excellent fixation.  After completion of synovectomy, diagnostic exam, meniscectomy and meniscus repair, and debridements as described, all compartments were checked and no residual debris remained. Hemostasis was achieved with the cautery wand. The portals were approximated with buried monocryl. All excess fluid was expressed from the joint.  Xeroform sterile gauze dressings were applied followed by Ace bandage and ice pack.   DISPOSITION: The patient was awakened from general anesthetic, extubated, taken to the recovery room in medically stable condition, no apparent complications.  Kendra Fowler will be 50% weightbearing for 4 weeks on the operative extremity.  She will take 81 mg aspirin twice daily x 6 weeks for DVT prophylaxis.  I will see her back in the office in 2 weeks for wound check.  She will be discharged home today from PACU.

## 2022-12-17 NOTE — Progress Notes (Signed)
Orthopedic Tech Progress Note Patient Details:  Kendra Fowler September 09, 1980 [id number] Bledsoe brace was delivered to patient in PACU.  Patient ID: Kendra Fowler, female   DOB: [birth year], 42 y.o.   MRN: [id number]  [name] 12/17/2022, 11:18 AM

## 2022-12-17 NOTE — Anesthesia Procedure Notes (Signed)
Procedure Name: LMA Insertion Date/Time: 12/17/2022 9:09 AM  Performed by: Vanessa Boise, CRNAPre-anesthesia Checklist: Emergency Drugs available, Patient identified, Suction available and Patient being monitored Patient Re-evaluated:Patient Re-evaluated prior to induction Oxygen Delivery Method: Circle system utilized Preoxygenation: Pre-oxygenation with 100% oxygen Induction Type: IV induction Ventilation: Mask ventilation without difficulty LMA: LMA inserted LMA Size: 4.0 Number of attempts: 1 Placement Confirmation: positive ETCO2 and breath sounds checked- equal and bilateral Tube secured with: Tape Dental Injury: Teeth and Oropharynx as per pre-operative assessment

## 2022-12-20 ENCOUNTER — Encounter (HOSPITAL_COMMUNITY): Payer: Self-pay | Admitting: Orthopedic Surgery

## 2022-12-20 NOTE — Anesthesia Postprocedure Evaluation (Signed)
Anesthesia Post Note  Patient: [name]  Procedure(s) Performed: KNEE ARTHROSCOPY, PARTIAL LATERAL MENISCECTOMY AND MEDIAL MENISCUS REPAIR (Left: Knee)     Patient location during evaluation: PACU Anesthesia Type: General Level of consciousness: awake and alert Pain management: pain level controlled Vital Signs Assessment: post-procedure vital signs reviewed and stable Respiratory status: spontaneous breathing, nonlabored ventilation, respiratory function stable and patient connected to nasal cannula oxygen Cardiovascular status: blood pressure returned to baseline and stable Postop Assessment: no apparent nausea or vomiting Anesthetic complications: no   No notable events documented.  Last Vitals:  Vitals:   12/17/22 1230 12/17/22 1249  BP: (!) 138/94 123/65  Pulse: 68 83  Resp: 13 15  Temp:  (!) 36.4 C  SpO2: 100% 100%    Last Pain:  Vitals:   12/17/22 1249  TempSrc: Temporal  PainSc: 2                  Amritpal Shropshire S

## 2022-12-31 ENCOUNTER — Other Ambulatory Visit (HOSPITAL_BASED_OUTPATIENT_CLINIC_OR_DEPARTMENT_OTHER): Payer: Self-pay

## 2022-12-31 MED ORDER — ZEPBOUND 10 MG/0.5ML ~~LOC~~ SOAJ
10.0000 mg | SUBCUTANEOUS | 0 refills | Status: DC
  Filled 2022-12-31 – 2023-01-17 (×2): qty 2, 28d supply, fill #0

## 2023-01-01 ENCOUNTER — Other Ambulatory Visit (HOSPITAL_BASED_OUTPATIENT_CLINIC_OR_DEPARTMENT_OTHER): Payer: Self-pay

## 2023-01-01 MED ORDER — ZEPBOUND 12.5 MG/0.5ML ~~LOC~~ SOAJ
12.5000 mg | SUBCUTANEOUS | 0 refills | Status: AC
  Filled 2023-01-01: qty 2, 28d supply, fill #0

## 2023-01-03 ENCOUNTER — Other Ambulatory Visit (HOSPITAL_BASED_OUTPATIENT_CLINIC_OR_DEPARTMENT_OTHER): Payer: Self-pay

## 2023-01-15 ENCOUNTER — Emergency Department (HOSPITAL_BASED_OUTPATIENT_CLINIC_OR_DEPARTMENT_OTHER): Payer: BC Managed Care – PPO

## 2023-01-15 ENCOUNTER — Encounter (HOSPITAL_BASED_OUTPATIENT_CLINIC_OR_DEPARTMENT_OTHER): Payer: Self-pay

## 2023-01-15 ENCOUNTER — Inpatient Hospital Stay (HOSPITAL_BASED_OUTPATIENT_CLINIC_OR_DEPARTMENT_OTHER)
Admission: EM | Admit: 2023-01-15 | Discharge: 2023-01-17 | DRG: 176 | Disposition: A | Discharge status: Home / Self Care | Payer: BC Managed Care – PPO | Attending: Student | Admitting: Student

## 2023-01-15 DIAGNOSIS — I2699 Other pulmonary embolism without acute cor pulmonale: Principal | ICD-10-CM | POA: Diagnosis present

## 2023-01-15 DIAGNOSIS — Z91128 Patient's intentional underdosing of medication regimen for other reason: Secondary | ICD-10-CM

## 2023-01-15 DIAGNOSIS — Z803 Family history of malignant neoplasm of breast: Secondary | ICD-10-CM

## 2023-01-15 DIAGNOSIS — I2694 Multiple subsegmental pulmonary emboli without acute cor pulmonale: Secondary | ICD-10-CM | POA: Diagnosis not present

## 2023-01-15 DIAGNOSIS — I82442 Acute embolism and thrombosis of left tibial vein: Secondary | ICD-10-CM | POA: Diagnosis present

## 2023-01-15 DIAGNOSIS — Z79899 Other long term (current) drug therapy: Secondary | ICD-10-CM

## 2023-01-15 DIAGNOSIS — Z9049 Acquired absence of other specified parts of digestive tract: Secondary | ICD-10-CM

## 2023-01-15 DIAGNOSIS — D509 Iron deficiency anemia, unspecified: Secondary | ICD-10-CM | POA: Diagnosis present

## 2023-01-15 DIAGNOSIS — T39016A Underdosing of aspirin, initial encounter: Secondary | ICD-10-CM | POA: Diagnosis present

## 2023-01-15 DIAGNOSIS — K219 Gastro-esophageal reflux disease without esophagitis: Secondary | ICD-10-CM | POA: Diagnosis present

## 2023-01-15 DIAGNOSIS — Y92009 Unspecified place in unspecified non-institutional (private) residence as the place of occurrence of the external cause: Secondary | ICD-10-CM

## 2023-01-15 DIAGNOSIS — Z7985 Long-term (current) use of injectable non-insulin antidiabetic drugs: Secondary | ICD-10-CM

## 2023-01-15 DIAGNOSIS — F419 Anxiety disorder, unspecified: Secondary | ICD-10-CM | POA: Diagnosis present

## 2023-01-15 DIAGNOSIS — Z9889 Other specified postprocedural states: Secondary | ICD-10-CM

## 2023-01-15 DIAGNOSIS — I82462 Acute embolism and thrombosis of left calf muscular vein: Secondary | ICD-10-CM | POA: Diagnosis present

## 2023-01-15 DIAGNOSIS — E282 Polycystic ovarian syndrome: Secondary | ICD-10-CM | POA: Diagnosis present

## 2023-01-15 DIAGNOSIS — Z880 Allergy status to penicillin: Secondary | ICD-10-CM

## 2023-01-15 DIAGNOSIS — Z91013 Allergy to seafood: Secondary | ICD-10-CM

## 2023-01-15 DIAGNOSIS — I82432 Acute embolism and thrombosis of left popliteal vein: Secondary | ICD-10-CM | POA: Diagnosis present

## 2023-01-15 DIAGNOSIS — Z6841 Body Mass Index (BMI) 40.0 and over, adult: Secondary | ICD-10-CM

## 2023-01-15 LAB — COMPREHENSIVE METABOLIC PANEL
ALT: 13 U/L (ref 0–44)
AST: 14 U/L — ABNORMAL LOW (ref 15–41)
Albumin: 3.4 g/dL — ABNORMAL LOW (ref 3.5–5.0)
Alkaline Phosphatase: 82 U/L (ref 38–126)
Anion gap: 7 (ref 5–15)
BUN: 12 mg/dL (ref 6–20)
CO2: 27 mmol/L (ref 22–32)
Calcium: 8.9 mg/dL (ref 8.9–10.3)
Chloride: 103 mmol/L (ref 98–111)
Creatinine, Ser: 1.1 mg/dL — ABNORMAL HIGH (ref 0.44–1.00)
GFR, Estimated: >60 mL/min (ref >60)
Glucose, Bld: 84 mg/dL (ref 70–99)
Potassium: 3.4 mmol/L — ABNORMAL LOW (ref 3.5–5.1)
Sodium: 137 mmol/L (ref 135–145)
Total Bilirubin: 0.5 mg/dL (ref 0.3–1.2)
Total Protein: 7.1 g/dL (ref 6.5–8.1)

## 2023-01-15 LAB — HCG, SERUM, QUALITATIVE: Preg, Serum: NEGATIVE

## 2023-01-15 LAB — CBC WITH DIFFERENTIAL/PLATELET
Abs Immature Granulocytes: 0.02 10*3/uL (ref 0.00–0.07)
Basophils Absolute: 0.1 10*3/uL (ref 0.0–0.1)
Basophils Relative: 1 %
Eosinophils Absolute: 0.2 10*3/uL (ref 0.0–0.5)
Eosinophils Relative: 2 %
HCT: 34.1 % — ABNORMAL LOW (ref 36.0–46.0)
Hemoglobin: 10.8 g/dL — ABNORMAL LOW (ref 12.0–15.0)
Immature Granulocytes: 0 %
Lymphocytes Relative: 21 %
Lymphs Abs: 1.8 10*3/uL (ref 0.7–4.0)
MCH: 25.5 pg — ABNORMAL LOW (ref 26.0–34.0)
MCHC: 31.7 g/dL (ref 30.0–36.0)
MCV: 80.6 fL (ref 80.0–100.0)
Monocytes Absolute: 0.5 10*3/uL (ref 0.1–1.0)
Monocytes Relative: 6 %
Neutro Abs: 5.9 10*3/uL (ref 1.7–7.7)
Neutrophils Relative %: 70 %
Platelets: 320 10*3/uL (ref 150–400)
RBC: 4.23 MIL/uL (ref 3.87–5.11)
RDW: 15.5 % (ref 11.5–15.5)
WBC: 8.5 10*3/uL (ref 4.0–10.5)
nRBC: 0 % (ref 0.0–0.2)

## 2023-01-15 LAB — BRAIN NATRIURETIC PEPTIDE: B Natriuretic Peptide: 28.5 pg/mL (ref 0.0–100.0)

## 2023-01-15 LAB — MRSA NEXT GEN BY PCR, NASAL: MRSA by PCR Next Gen: NOT DETECTED

## 2023-01-15 LAB — TROPONIN I (HIGH SENSITIVITY)
Troponin I (High Sensitivity): 3 ng/L (ref <18)
Troponin I (High Sensitivity): 3 ng/L (ref <18)

## 2023-01-15 LAB — LIPASE, BLOOD: Lipase: 22 U/L (ref 11–51)

## 2023-01-15 LAB — HEPARIN LEVEL (UNFRACTIONATED)
Heparin Unfractionated: 0.36 IU/mL (ref 0.30–0.70)
Heparin Unfractionated: 0.35 IU/mL (ref 0.30–0.70)

## 2023-01-15 LAB — LACTIC ACID, PLASMA
Lactic Acid, Venous: 0.9 mmol/L (ref 0.5–1.9)
Lactic Acid, Venous: 0.5 mmol/L (ref 0.5–1.9)

## 2023-01-15 LAB — D-DIMER, QUANTITATIVE: D-Dimer, Quant: 4.88 ug/mL-FEU — ABNORMAL HIGH (ref 0.00–0.50)

## 2023-01-15 MED ORDER — SENNA 8.6 MG PO TABS
1.0000 | ORAL_TABLET | Freq: Two times a day (BID) | ORAL | Status: DC
  Administered 2023-01-15 – 2023-01-17 (×3): 8.6 mg via ORAL
  Filled 2023-01-15 (×3): qty 1

## 2023-01-15 MED ORDER — KETOROLAC TROMETHAMINE 30 MG/ML IJ SOLN
30.0000 mg | Freq: Four times a day (QID) | INTRAMUSCULAR | Status: DC
  Administered 2023-01-15 – 2023-01-16 (×6): 30 mg via INTRAVENOUS
  Filled 2023-01-15 (×7): qty 1

## 2023-01-15 MED ORDER — IOHEXOL 350 MG/ML SOLN
100.0000 mL | Freq: Once | INTRAVENOUS | Status: AC | PRN
  Administered 2023-01-15: 100 mL via INTRAVENOUS

## 2023-01-15 MED ORDER — ONDANSETRON HCL 4 MG/2ML IJ SOLN
4.0000 mg | Freq: Once | INTRAMUSCULAR | Status: AC
  Administered 2023-01-15: 4 mg via INTRAVENOUS
  Filled 2023-01-15: qty 2

## 2023-01-15 MED ORDER — HEPARIN (PORCINE) 25000 UT/250ML-% IV SOLN
1500.0000 [IU]/h | INTRAVENOUS | Status: DC
  Administered 2023-01-15 – 2023-01-16 (×3): 1500 [IU]/h via INTRAVENOUS
  Filled 2023-01-15 (×3): qty 250

## 2023-01-15 MED ORDER — KETOROLAC TROMETHAMINE 30 MG/ML IJ SOLN: 30.0000 mg | Freq: Four times a day (QID) | INTRAMUSCULAR | Status: DC | PRN

## 2023-01-15 MED ORDER — ZOLPIDEM TARTRATE 5 MG PO TABS: 5.0000 mg | ORAL_TABLET | Freq: Every evening | ORAL | Status: DC | PRN

## 2023-01-15 MED ORDER — ACETAMINOPHEN 650 MG RE SUPP: 650.0000 mg | Freq: Four times a day (QID) | RECTAL | Status: DC | PRN

## 2023-01-15 MED ORDER — ACETAMINOPHEN 325 MG PO TABS
650.0000 mg | ORAL_TABLET | Freq: Four times a day (QID) | ORAL | Status: DC | PRN
  Administered 2023-01-15: 650 mg via ORAL
  Filled 2023-01-15: qty 2

## 2023-01-15 MED ORDER — MORPHINE SULFATE (PF) 4 MG/ML IV SOLN
4.0000 mg | Freq: Once | INTRAVENOUS | Status: AC
  Administered 2023-01-15: 4 mg via INTRAVENOUS
  Filled 2023-01-15: qty 1

## 2023-01-15 MED ORDER — HYDROMORPHONE HCL 1 MG/ML IJ SOLN
0.5000 mg | Freq: Once | INTRAMUSCULAR | Status: AC
  Administered 2023-01-15: 0.5 mg via INTRAVENOUS
  Filled 2023-01-15: qty 1

## 2023-01-15 NOTE — Progress Notes (Signed)
ANTICOAGULATION CONSULT NOTE  Pharmacy Consult for heparin Indication: pulmonary embolus  Allergies  Allergen Reactions   Lobster [Shellfish Allergy] Itching, Nausea And Vomiting and Swelling    Also, crab   Penicillins Anaphylaxis    Per patient facial and tongue swelling    Patient Measurements: Height: 5\' 7"  (170.2 cm) Weight: 132.5 kg (292 lb) IBW/kg (Calculated) : 61.6 Heparin Dosing Weight: 94kg  Vital Signs: Temp: 98.2 F (36.8 C) (08/10 1504) Temp Source: Oral (08/10 1504) BP: 118/79 (08/10 1700) Pulse Rate: 85 (08/10 1700)  Labs: Recent Labs    01/15/23 0819 01/15/23 1031 01/15/23 1728  HGB 10.8*  --   --   HCT 34.1*  --   --   PLT 320  --   --   HEPARINUNFRC  --   --  0.36  CREATININE 1.10*  --   --   TROPONINIHS 3 3  --     Estimated Creatinine Clearance: 95.6 mL/min (A) (by C-G formula based on SCr of 1.1 mg/dL (H)).  Assessment: 82 YOF presenting with SOB, CT with PE and alveolar hemorrhage likely sequela of PE.  She is not on anticoagulation PTA.  Discussed with MD and will target low end goals, no bolus considering alveolar hemorrhage.  Heparin level is therapeutic at 0.36 at 1500 units/hr. Hgb low at 10.8, platelets are normal. No issues with bleeding or infusion per RN.  Goal of Therapy:  Heparin level 0.3-0.5 units/ml Monitor platelets by anticoagulation protocol: Yes   Plan:  Continue heparin gtt at 1500 units/hr F/u 6 hour confirmatory heparin level Daily heparin level, CBC Monitor for s/sx of bleeding May transition to DOAC after 24h  Thank you for involving pharmacy in this patient's care.  Loura Back, PharmD, BCPS Clinical Pharmacist Clinical phone for 01/15/2023 is [phone] 01/15/2023 6:40 PM

## 2023-01-15 NOTE — ED Provider Notes (Signed)
[place] [facility] AT [facility] Provider Note   CSN: [id number] Arrival date & time: 01/15/23  0801     History  Chief Complaint  Patient presents with   Leg Pain    Kendra Fowler is a 42 y.o. female.  HPI 42 year old female history of GERD, anxiety, anemia presenting for chest pain shortness of breath.  She states since yesterday she has had pleuritic chest pain.  It is in the right backside of her chest and radiates to the front side of her right side of her chest.  It is worse with inspiration she has mild shortness of breath as well.  She is some dizziness earlier which has resolved.  No history of ACS, stroke, PE or DVT.  She did have knee surgery about a month ago.  She has had worsening swelling of her left leg and is concerned for blood clot.  No swelling in her right leg.  No fever, cough, skin changes.  No frank abdominal pain.  No urinary symptoms.     Home Medications Prior to Admission medications   Medication Sig Start Date End Date Taking? Authorizing Provider  albuterol (VENTOLIN HFA) 108 (90 Base) MCG/ACT inhaler Inhale 1-2 puffs into the lungs every 6 (six) hours as needed for wheezing or shortness of breath. 01/22/20   Nira Conn, MD  aspirin-acetaminophen-caffeine (EXCEDRIN MIGRAINE) [phone] MG tablet Take by mouth every 6 (six) hours as needed for headache.    [provider]  diclofenac Sodium (VOLTAREN) 1 % GEL Apply 2 g topically 4 (four) times daily. Apply to the left knee 08/28/22   Gwyneth Sprout, MD  fluconazole (DIFLUCAN) 150 MG tablet Take 150 mg by mouth daily.    [provider]  furosemide (LASIX) 20 MG tablet Take 20 mg by mouth as needed for edema.    [provider]  furosemide (LASIX) 20 MG tablet Take 1 tablet (20 mg total) by mouth daily. 12/04/22     hydrochlorothiazide (HYDRODIURIL) 25 MG tablet TK 1 T PO QD 11/10/14   [provider]  ibuprofen (ADVIL) 800 MG  tablet Take 800 mg by mouth every 8 (eight) hours as needed.    [provider]  metoprolol tartrate (LOPRESSOR) 25 MG tablet Take 12.5 mg by mouth 2 (two) times daily.    [provider]  Multiple Vitamins-Minerals (MULTIVITAMINS THER. W/MINERALS) TABS Take 1 tablet by mouth daily.      [provider]  naproxen (NAPROSYN) 500 MG tablet Take 1 tablet (500 mg total) by mouth 2 (two) times daily. 08/28/22   Gwyneth Sprout, MD  ondansetron (ZOFRAN) 4 MG tablet Take 1 tablet (4 mg total) by mouth every 8 (eight) hours as needed for nausea or vomiting. 12/17/22   Yolonda Kida, MD  oxyCODONE (ROXICODONE) 5 MG immediate release tablet Take 1 tablet (5 mg total) by mouth every 4 (four) hours as needed for severe pain. 12/17/22 12/17/23  Yolonda Kida, MD  pantoprazole (PROTONIX) 20 MG tablet Take 1 tablet (20 mg total) by mouth daily for 14 days. 01/09/21 01/23/21  Curatolo, Adam, DO  predniSONE (DELTASONE) 20 MG tablet Take 2 tablets (40 mg total) by mouth daily. 08/28/22   Gwyneth Sprout, MD  spironolactone (ALDACTONE) 25 MG tablet Take 25 mg by mouth daily.    [provider]  tirzepatide (ZEPBOUND) 10 MG/0.5ML Pen Inject 10 mg into the skin once a week. 10/27/22     tirzepatide (ZEPBOUND) 10 MG/0.5ML Pen  Inject 10 mg into the skin once a week. 12/30/22     tirzepatide (ZEPBOUND) 12.5 MG/0.5ML Pen Inject 12.5 mg into the skin once a week. 12/31/22     tirzepatide (ZEPBOUND) 7.5 MG/0.5ML Pen Inject 7.5 mg into the skin once a week. 09/21/22     traMADol (ULTRAM) 50 MG tablet Take 50 mg by mouth 3 (three) times daily.    [provider]  traMADol (ULTRAM) 50 MG tablet Take 1 tablet (50 mg total) by mouth 3 (three) times daily.Don't drive while taking this medication 12/04/22     Vitamin D, Ergocalciferol, (DRISDOL) 1.25 MG (50000 UNIT) CAPS capsule Take 50,000 Units by mouth every 7 (seven) days. 11/16/22   [provider]      Allergies     Lobster [shellfish allergy] and Penicillins    Review of Systems   Review of Systems Review of systems completed and notable as per HPI.  ROS otherwise negative.   Physical Exam Updated Vital Signs BP 119/75   Pulse 85   Temp 99 F (37.2 C)   Resp (!) 28   Ht 5\' 7"  (1.702 m)   Wt 132.5 kg   LMP 01/13/2023   SpO2 100%   BMI 45.73 kg/m  Physical Exam Vitals and nursing note reviewed.  Constitutional:      General: She is not in acute distress.    Appearance: She is well-developed.  HENT:     Head: Normocephalic and atraumatic.  Eyes:     Conjunctiva/sclera: Conjunctivae normal.  Cardiovascular:     Rate and Rhythm: Normal rate and regular rhythm.     Heart sounds: No murmur heard. Pulmonary:     Effort: Pulmonary effort is normal. No respiratory distress.     Breath sounds: Normal breath sounds.  Abdominal:     Palpations: Abdomen is soft.     Tenderness: There is abdominal tenderness. There is no right CVA tenderness, left CVA tenderness, guarding or rebound.     Comments: Mild tenderness right upper quadrant  Musculoskeletal:        General: No swelling.     Cervical back: Neck supple.     Right lower leg: No edema.     Left lower leg: Edema present.     Comments: Mild swelling left lower extremity.  DP and PT pulse intact.  No erythema, induration, fluctuance.  Skin:    General: Skin is warm and dry.     Capillary Refill: Capillary refill takes less than 2 seconds.  Neurological:     Mental Status: She is alert.  Psychiatric:        Mood and Affect: Mood normal.     ED Results / Procedures / Treatments   Labs (all labs ordered are listed, but only abnormal results are displayed) Labs Reviewed  COMPREHENSIVE METABOLIC PANEL - Abnormal; Notable for the following components:      Result Value   Potassium 3.4 (*)    Creatinine, Ser 1.10 (*)    Albumin 3.4 (*)    AST 14 (*)    All other components within normal limits  CBC WITH DIFFERENTIAL/PLATELET -  Abnormal; Notable for the following components:   Hemoglobin 10.8 (*)    HCT 34.1 (*)    MCH 25.5 (*)    All other components within normal limits  D-DIMER, QUANTITATIVE - Abnormal; Notable for the following components:   D-Dimer, Quant 4.88 (*)    All other components within normal limits  LIPASE, BLOOD  HCG, SERUM, QUALITATIVE  URINALYSIS, ROUTINE W REFLEX MICROSCOPIC  BRAIN NATRIURETIC PEPTIDE  LACTIC ACID, PLASMA  LACTIC ACID, PLASMA  HEPARIN LEVEL (UNFRACTIONATED)  TROPONIN I (HIGH SENSITIVITY)  TROPONIN I (HIGH SENSITIVITY)    EKG EKG Interpretation Date/Time:  Saturday January 15 2023 08:37:50 EDT Ventricular Rate:  93 PR Interval:  179 QRS Duration:  92 QT Interval:  340 QTC Calculation: 423 R Axis:   83  Text Interpretation: Sinus rhythm Borderline T wave abnormalities Confirmed by Fulton Reek [phone]) on 01/15/2023 8:39:40 AM  Radiology US Venous Img Lower  Left (DVT Study)  Result Date: 01/15/2023 CLINICAL DATA:  Left lower extremity swelling EXAM: LEFT LOWER EXTREMITY VENOUS DOPPLER ULTRASOUND TECHNIQUE: Gray-scale sonography with graded compression, as well as color Doppler and duplex ultrasound were performed to evaluate the lower extremity deep venous systems from the level of the common femoral vein and including the common femoral, femoral, profunda femoral, popliteal and calf veins including the posterior tibial, peroneal and gastrocnemius veins when visible. The superficial great saphenous vein was also interrogated. Spectral Doppler was utilized to evaluate flow at rest and with distal augmentation maneuvers in the common femoral, femoral and popliteal veins. COMPARISON:  None available FINDINGS: Contralateral Common Femoral Vein: Respiratory phasicity is normal and symmetric with the symptomatic side. No evidence of thrombus. Normal compressibility. Common Femoral Vein: No evidence of thrombus. Normal compressibility, respiratory phasicity and response to  augmentation. Saphenofemoral Junction: No evidence of thrombus. Normal compressibility and flow on color Doppler imaging. Profunda Femoral Vein: No evidence of thrombus. Normal compressibility and flow on color Doppler imaging. Femoral Vein: No evidence of thrombus. Normal compressibility, respiratory phasicity and response to augmentation. Popliteal Vein: Acute occlusive thrombus causing decreased flow and compressibility. Calf Veins: Acute occlusive thrombus of the posterior tibial and gastrocnemius veins causing decreased flow and compressibility. Superficial Great Saphenous Vein: No evidence of thrombus. Normal compressibility. Venous Reflux:  None. Other Findings:  None. IMPRESSION: Acute DVT of the left popliteal, posterior tibial, and gastrocnemius veins. Electronically Signed   By: Acquanetta Belling M.D.   On: 01/15/2023 10:18   CT Angio Chest PE W/Cm &/Or Wo Cm  Result Date: 01/15/2023 CLINICAL DATA:  42 year old female with history of pleuritic chest pain and right-sided back pain. Right upper quadrant abdominal pain. EXAM: CT ANGIOGRAPHY CHEST CT ABDOMEN AND PELVIS WITH CONTRAST TECHNIQUE: Multidetector CT imaging of the chest was performed using the standard protocol during bolus administration of intravenous contrast. Multiplanar CT image reconstructions and MIPs were obtained to evaluate the vascular anatomy. Multidetector CT imaging of the abdomen and pelvis was performed using the standard protocol during bolus administration of intravenous contrast. RADIATION DOSE REDUCTION: This exam was performed according to the departmental dose-optimization program which includes automated exposure control, adjustment of the mA and/or kV according to patient size and/or use of iterative reconstruction technique. CONTRAST:  OMNIPAQUE IOHEXOL 350 MG/ML SOLN COMPARISON:  CT of the abdomen and pelvis 02/17/2020. No prior chest CT. FINDINGS: CTA CHEST FINDINGS Cardiovascular: Multiple filling defects are noted  in the pulmonary arterial tree bilaterally, including lobar and segmental sized filling defects in the left upper lobe, and segmental and subsegmental sized filling defects in the right lower lobe, indicative of bilateral pulmonary embolism. On the right, this appears nonocclusive at this time. Pulmonic trunk is mildly dilated measuring 3.4 cm in diameter. Estimated right ventricular diameter of 4.9 cm. Estimated left ventricular diameter of 5.3 cm. RV to LV ratio of 0.93. There is no significant pericardial fluid,  thickening or pericardial calcification. No atherosclerotic calcifications are noted in the thoracic aorta or the coronary arteries. Mediastinum/Nodes: No pathologically enlarged mediastinal or hilar lymph nodes. Esophagus is unremarkable in appearance. No axillary lymphadenopathy. Lungs/Pleura: There is a small amount a of ground-glass attenuation in the basal segments of the right lower lobe near the costophrenic sulcus, likely to reflect some alveolar hemorrhage in the setting of pulmonary infarction. Left lung is clear. No pleural effusions. No suspicious appearing pulmonary nodules or masses are noted. Musculoskeletal: There are no aggressive appearing lytic or blastic lesions noted in the visualized portions of the skeleton. Review of the MIP images confirms the above findings. CT ABDOMEN and PELVIS FINDINGS Hepatobiliary: No suspicious cystic or solid hepatic lesions. No intra or extrahepatic biliary ductal dilatation. Status post cholecystectomy. Pancreas: No pancreatic mass. No pancreatic ductal dilatation. No pancreatic or peripancreatic fluid collections or inflammatory changes. Spleen: Unremarkable. Adrenals/Urinary Tract: Bilateral kidneys and adrenal glands are normal in appearance. No hydroureteronephrosis. Urinary bladder is normal in appearance. Stomach/Bowel: The appearance of the stomach is normal. No pathologic dilatation of small bowel or colon. Normal appendix. Vascular/Lymphatic: No  significant atherosclerotic disease, aneurysm or dissection noted in the abdominal or pelvic vasculature. No lymphadenopathy noted in the abdomen or pelvis. Reproductive: Uterus and ovaries are unremarkable in appearance. Other: No significant volume of ascites.  No pneumoperitoneum. Musculoskeletal: There are no aggressive appearing lytic or blastic lesions noted in the visualized portions of the skeleton. Review of the MIP images confirms the above findings. IMPRESSION: 1. Study is positive for pulmonary embolism bilaterally. There is also small amount of what appears to be alveolar hemorrhage in the right lower lobe, likely sequela of pulmonary infarct. 2. Mild dilatation of the pulmonic trunk, which could suggest developing pulmonary arterial hypertension. 3. No acute findings are noted in the abdomen or pelvis to account for the patient's symptoms. Electronically Signed   By: Trudie Reed M.D.   On: 01/15/2023 09:55   CT ABDOMEN PELVIS W CONTRAST  Result Date: 01/15/2023 CLINICAL DATA:  42 year old female with history of pleuritic chest pain and right-sided back pain. Right upper quadrant abdominal pain. EXAM: CT ANGIOGRAPHY CHEST CT ABDOMEN AND PELVIS WITH CONTRAST TECHNIQUE: Multidetector CT imaging of the chest was performed using the standard protocol during bolus administration of intravenous contrast. Multiplanar CT image reconstructions and MIPs were obtained to evaluate the vascular anatomy. Multidetector CT imaging of the abdomen and pelvis was performed using the standard protocol during bolus administration of intravenous contrast. RADIATION DOSE REDUCTION: This exam was performed according to the departmental dose-optimization program which includes automated exposure control, adjustment of the mA and/or kV according to patient size and/or use of iterative reconstruction technique. CONTRAST:  OMNIPAQUE IOHEXOL 350 MG/ML SOLN COMPARISON:  CT of the abdomen and pelvis 02/17/2020. No  prior chest CT. FINDINGS: CTA CHEST FINDINGS Cardiovascular: Multiple filling defects are noted in the pulmonary arterial tree bilaterally, including lobar and segmental sized filling defects in the left upper lobe, and segmental and subsegmental sized filling defects in the right lower lobe, indicative of bilateral pulmonary embolism. On the right, this appears nonocclusive at this time. Pulmonic trunk is mildly dilated measuring 3.4 cm in diameter. Estimated right ventricular diameter of 4.9 cm. Estimated left ventricular diameter of 5.3 cm. RV to LV ratio of 0.93. There is no significant pericardial fluid, thickening or pericardial calcification. No atherosclerotic calcifications are noted in the thoracic aorta or the coronary arteries. Mediastinum/Nodes: No pathologically enlarged mediastinal or hilar lymph nodes.  Esophagus is unremarkable in appearance. No axillary lymphadenopathy. Lungs/Pleura: There is a small amount a of ground-glass attenuation in the basal segments of the right lower lobe near the costophrenic sulcus, likely to reflect some alveolar hemorrhage in the setting of pulmonary infarction. Left lung is clear. No pleural effusions. No suspicious appearing pulmonary nodules or masses are noted. Musculoskeletal: There are no aggressive appearing lytic or blastic lesions noted in the visualized portions of the skeleton. Review of the MIP images confirms the above findings. CT ABDOMEN and PELVIS FINDINGS Hepatobiliary: No suspicious cystic or solid hepatic lesions. No intra or extrahepatic biliary ductal dilatation. Status post cholecystectomy. Pancreas: No pancreatic mass. No pancreatic ductal dilatation. No pancreatic or peripancreatic fluid collections or inflammatory changes. Spleen: Unremarkable. Adrenals/Urinary Tract: Bilateral kidneys and adrenal glands are normal in appearance. No hydroureteronephrosis. Urinary bladder is normal in appearance. Stomach/Bowel: The appearance of the stomach is  normal. No pathologic dilatation of small bowel or colon. Normal appendix. Vascular/Lymphatic: No significant atherosclerotic disease, aneurysm or dissection noted in the abdominal or pelvic vasculature. No lymphadenopathy noted in the abdomen or pelvis. Reproductive: Uterus and ovaries are unremarkable in appearance. Other: No significant volume of ascites.  No pneumoperitoneum. Musculoskeletal: There are no aggressive appearing lytic or blastic lesions noted in the visualized portions of the skeleton. Review of the MIP images confirms the above findings. IMPRESSION: 1. Study is positive for pulmonary embolism bilaterally. There is also small amount of what appears to be alveolar hemorrhage in the right lower lobe, likely sequela of pulmonary infarct. 2. Mild dilatation of the pulmonic trunk, which could suggest developing pulmonary arterial hypertension. 3. No acute findings are noted in the abdomen or pelvis to account for the patient's symptoms. Electronically Signed   By: Trudie Reed M.D.   On: 01/15/2023 09:55    Procedures Procedures    Medications Ordered in ED Medications  heparin ADULT infusion 100 units/mL (25000 units/277mL) (1,500 Units/hr Intravenous New Bag/Given 01/15/23 1048)  morphine (PF) 4 MG/ML injection 4 mg (4 mg Intravenous Given 01/15/23 0834)  ondansetron (ZOFRAN) injection 4 mg (4 mg Intravenous Given 01/15/23 0833)  iohexol (OMNIPAQUE) 350 MG/ML injection 100 mL (100 mLs Intravenous Contrast Given 01/15/23 0923)  HYDROmorphone (DILAUDID) injection 0.5 mg (0.5 mg Intravenous Given 01/15/23 1027)    ED Course/ Medical Decision Making/ A&P Clinical Course as of 01/15/23 1111  Sat Jan 15, 2023  0839 HEAR 2 [JD]    Clinical Course User Index [JD] Laurence Spates, MD             HEART Score: 2                    Medical Decision Making Amount and/or Complexity of Data Reviewed Labs: ordered. Radiology: ordered.  Risk Prescription drug management. Decision  regarding hospitalization.   Medical Decision Making:   Kendra Fowler is a 42 y.o. female who presented to the ED today with right-sided pleuritic chest pain, left leg swelling.  Vital signs reviewed notable for mild tachycardia, afebrile.  On exam she has pain with deep inspiration.  She had recent surgery and left leg swelling, concern for possible DVT and PE.  Will obtain workup for this.  She also has some mild tenderness in the right upper quadrant.  She status post cholecystectomy but given how low her chest pain is we will CT her abdomen to evaluate for cause of possible referred pain from her abdomen.  EKG is reassuring, lower concern for ACS.  Patient placed on continuous vitals and telemetry monitoring while in ED which was reviewed periodically.  Reviewed and confirmed nursing documentation for past medical history, family history, social history.  Initial Study Results:   Laboratory  All laboratory results reviewed.  Labs notable for troponin normal, D-dimer elevated, mild anemia.  CMP notable for potassium 3.4.  EKG EKG was reviewed independently.  Radiology:  All images reviewed independently. Agree with radiology report at this time.      Consults: Case discussed with pulmonology, hospitalist.   Reassessment and Plan:   DVT in the left popliteal vein.  She also has bilateral PEs with small alveolar hemorrhage on the right.  She has no hemoptysis.  She is not tachycardic, hypoxic, or hypotensive.  I did discuss the patient with pulmonology, they recommend heparin drip without bolus and admission.  Discussed with pharmacy as well.  Patient notified of plan.  Discussed with Dr. Artis Flock with hospitalist.  Patient [facility] be [facility].   Patient's presentation is most consistent with acute presentation with potential threat to life or bodily function.           Final Clinical Impression(s) / ED Diagnoses Final diagnoses:  PE (pulmonary thromboembolism)  (HCC)    Rx / DC Orders ED Discharge Orders     None         Laurence Spates, MD 01/15/23 [phone]

## 2023-01-15 NOTE — H&P (Signed)
History and Physical    Kendra Fowler QMV:[id number] DOB: 03/19/1981 DOA: 01/15/2023  DOS: the patient was seen and examined on 01/15/2023  PCP: Jonna Clark, MD   Patient coming from:  transfer from [facility]  I have personally briefly reviewed patient's old medical records in [facility]  Ms. Fowler Kendra Juniper, a 42 y/o with a relatively benign medical history with no active medical problems underwent orthopedic knee surgery about 1 month ago. She developed swelling of the left calf and also became mildly short of breath. She presented to [facility] for evaluation where she was found by U/S to have Acute DVTs left popliteal,posterior tibial and gastrocnemius veins; CTA chest revealed  lobar and segmental LUL and RLL PE with possible hemorrhage RLL. She was started on IV heparin and transferred to [facility] for continued treatment.    ED Course: taken as transfer  Review of Systems:  Review of Systems  Constitutional: Negative.   HENT: Negative.    Eyes: Negative.   Respiratory: Negative.    Cardiovascular:  Positive for chest pain. Negative for palpitations and orthopnea.       Chest pain right with inspiration   Gastrointestinal: Negative.   Genitourinary: Negative.   Musculoskeletal: Negative.   Skin: Negative.   Neurological: Negative.   Endo/Heme/Allergies: Negative.   Psychiatric/Behavioral: Negative.      Past Medical History:  Diagnosis Date   Anemia    Anxiety    Arthritis    Complication of anesthesia    took a long time waking up after her cholecystectomy   Dysrhythmia    Palpitations   Gallbladder attack    GERD (gastroesophageal reflux disease)    Neuromuscular disorder (HCC)    nerve pain down Left leg,   Ovarian cyst    PCOS (polycystic ovarian syndrome)    Thyroid disease    Thyroid nodules,  monitored by endocrinology    Past Surgical History:  Procedure Laterality Date   CHOLECYSTECTOMY     KNEE ARTHROSCOPY WITH MENISCAL REPAIR Left 12/17/2022    Procedure: KNEE ARTHROSCOPY, PARTIAL LATERAL MENISCECTOMY AND MEDIAL MENISCUS REPAIR;  Surgeon: Yolonda Kida, MD;  Location: [facility];  Service: Orthopedics;  Laterality: Left;   WISDOM TOOTH EXTRACTION     \ Soc Hx - married. Four children, 3 sons, 1 daughter. 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 Marriage is in good health   reports that she has never smoked. She has never used smokeless tobacco. She reports that she does not drink alcohol and does not use drugs.  Allergies  Allergen Reactions   Lobster [Shellfish Allergy] Itching, Nausea And Vomiting and Swelling    Also, crab   Penicillins Anaphylaxis    Per patient facial and tongue swelling    Family History  Problem Relation Age of Onset   Breast cancer Other        Aunt and Grandmother    Prior to Admission medications   Medication Sig Start Date End Date Taking? Authorizing Provider  albuterol (VENTOLIN HFA) 108 (90 Base) MCG/ACT inhaler Inhale 1-2 puffs into the lungs every 6 (six) hours as needed for wheezing or shortness of breath. 01/22/20   Nira Conn, MD  aspirin-acetaminophen-caffeine (EXCEDRIN MIGRAINE) [phone] MG tablet Take by mouth every 6 (six) hours as needed for headache.    [provider]  diclofenac Sodium (VOLTAREN) 1 %  GEL Apply 2 g topically 4 (four) times daily. Apply to the left knee 08/28/22   Gwyneth Sprout, MD  fluconazole (DIFLUCAN) 150 MG tablet Take 150 mg by mouth daily.    [provider]  furosemide (LASIX) 20 MG tablet Take 20 mg by mouth as needed for edema.    [provider]  furosemide (LASIX) 20 MG tablet Take 1 tablet (20 mg total) by mouth daily. 12/04/22     hydrochlorothiazide (HYDRODIURIL) 25 MG tablet TK 1 T PO QD 11/10/14   [provider]  ibuprofen (ADVIL) 800 MG tablet Take 800 mg by  mouth every 8 (eight) hours as needed.    [provider]  metoprolol tartrate (LOPRESSOR) 25 MG tablet Take 12.5 mg by mouth 2 (two) times daily.    [provider]  Multiple Vitamins-Minerals (MULTIVITAMINS THER. W/MINERALS) TABS Take 1 tablet by mouth daily.      [provider]  naproxen (NAPROSYN) 500 MG tablet Take 1 tablet (500 mg total) by mouth 2 (two) times daily. 08/28/22   Gwyneth Sprout, MD  ondansetron (ZOFRAN) 4 MG tablet Take 1 tablet (4 mg total) by mouth every 8 (eight) hours as needed for nausea or vomiting. 12/17/22   Yolonda Kida, MD  oxyCODONE (ROXICODONE) 5 MG immediate release tablet Take 1 tablet (5 mg total) by mouth every 4 (four) hours as needed for severe pain. 12/17/22 12/17/23  Yolonda Kida, MD  pantoprazole (PROTONIX) 20 MG tablet Take 1 tablet (20 mg total) by mouth daily for 14 days. 01/09/21 01/23/21  Curatolo, Adam, DO  predniSONE (DELTASONE) 20 MG tablet Take 2 tablets (40 mg total) by mouth daily. 08/28/22   Gwyneth Sprout, MD  spironolactone (ALDACTONE) 25 MG tablet Take 25 mg by mouth daily.    [provider]  tirzepatide (ZEPBOUND) 10 MG/0.5ML Pen Inject 10 mg into the skin once a week. 10/27/22     tirzepatide (ZEPBOUND) 10 MG/0.5ML Pen Inject 10 mg into the skin once a week. 12/30/22     tirzepatide (ZEPBOUND) 12.5 MG/0.5ML Pen Inject 12.5 mg into the skin once a week. 12/31/22     tirzepatide (ZEPBOUND) 7.5 MG/0.5ML Pen Inject 7.5 mg into the skin once a week. 09/21/22     traMADol (ULTRAM) 50 MG tablet Take 50 mg by mouth 3 (three) times daily.    [provider]  traMADol (ULTRAM) 50 MG tablet Take 1 tablet (50 mg total) by mouth 3 (three) times daily.Don't drive while taking this medication 12/04/22     Vitamin D, Ergocalciferol, (DRISDOL) 1.25 MG (50000 UNIT) CAPS capsule Take 50,000 Units by mouth every 7 (seven) days. 11/16/22   [provider]    Physical Exam: Vitals:   01/15/23  1315 01/15/23 1327 01/15/23 1330 01/15/23 1504  BP: 104/62  115/85 (!) 111/92  Pulse: 94  91   Resp: 18  16 20   Temp:  98.7 F (37.1 C)  98.2 F (36.8 C)  TempSrc:  Oral  Oral  SpO2: 99%  100% 98%  Weight:      Height:        Physical Exam Vitals and nursing note reviewed.  Constitutional:      General: She is not in acute distress.    Appearance: Normal appearance. She is not ill-appearing.     Comments: overweight  HENT:     Head: Normocephalic and atraumatic.     Nose: Nose normal.     Mouth/Throat:  Mouth: Mucous membranes are moist.     Pharynx: Oropharynx is clear. No oropharyngeal exudate.  Eyes:     Extraocular Movements: Extraocular movements intact.     Conjunctiva/sclera: Conjunctivae normal.     Pupils: Pupils are equal, round, and reactive to light.  Cardiovascular:     Rate and Rhythm: Normal rate and regular rhythm.     Pulses: Normal pulses.     Heart sounds: Normal heart sounds.  Pulmonary:     Effort: Pulmonary effort is normal.     Breath sounds: No wheezing, rhonchi or rales.     Comments: Decreased breath sounds 2/2 poor effort - pleuritic pain is limiting factor Abdominal:     General: Bowel sounds are normal.     Palpations: Abdomen is soft.  Musculoskeletal:        General: Swelling present. Normal range of motion.     Cervical back: Normal range of motion and neck supple.     Right lower leg: No edema.     Left lower leg: No edema.     Comments: Wearing post-op knee brace left leg. Left leg is swollen.   Skin:    General: Skin is warm and dry.  Neurological:     General: No focal deficit present.     Mental Status: She is alert and oriented to person, place, and time.     Cranial Nerves: No cranial nerve deficit.  Psychiatric:        Mood and Affect: Mood normal.        Behavior: Behavior normal.      Labs on Admission: I have personally reviewed following labs and imaging studies  CBC: Recent Labs  Lab 01/15/23 0819  WBC 8.5   NEUTROABS 5.9  HGB 10.8*  HCT 34.1*  MCV 80.6  PLT 320   Basic Metabolic Panel: Recent Labs  Lab 01/15/23 0819  NA 137  K 3.4*  CL 103  CO2 27  GLUCOSE 84  BUN 12  CREATININE 1.10*  CALCIUM 8.9   GFR: Estimated Creatinine Clearance: 95.6 mL/min (A) (by C-G formula based on SCr of 1.1 mg/dL (H)). Liver Function Tests: Recent Labs  Lab 01/15/23 0819  AST 14*  ALT 13  ALKPHOS 82  BILITOT 0.5  PROT 7.1  ALBUMIN 3.4*   Recent Labs  Lab 01/15/23 0821  LIPASE 22   No results for input(s): "AMMONIA" in the last 168 hours. Coagulation Profile: No results for input(s): "INR", "PROTIME" in the last 168 hours. Cardiac Enzymes: No results for input(s): "CKTOTAL", "CKMB", "CKMBINDEX", "TROPONINI" in the last 168 hours. BNP (last 3 results) No results for input(s): "PROBNP" in the last 8760 hours. HbA1C: No results for input(s): "HGBA1C" in the last 72 hours. CBG: No results for input(s): "GLUCAP" in the last 168 hours. Lipid Profile: No results for input(s): "CHOL", "HDL", "LDLCALC", "TRIG", "CHOLHDL", "LDLDIRECT" in the last 72 hours. Thyroid Function Tests: No results for input(s): "TSH", "T4TOTAL", "FREET4", "T3FREE", "THYROIDAB" in the last 72 hours. Anemia Panel: No results for input(s): "VITAMINB12", "FOLATE", "FERRITIN", "TIBC", "IRON", "RETICCTPCT" in the last 72 hours. Urine analysis:    Component Value Date/Time   COLORURINE YELLOW 02/17/2020 0508   APPEARANCEUR CLEAR 02/17/2020 0508   LABSPEC 1.020 02/17/2020 0508   PHURINE 5.5 02/17/2020 0508   GLUCOSEU NEGATIVE 02/17/2020 0508   HGBUR SMALL (A) 02/17/2020 0508   BILIRUBINUR NEGATIVE 02/17/2020 0508   KETONESUR NEGATIVE 02/17/2020 0508   PROTEINUR NEGATIVE 02/17/2020 0508   UROBILINOGEN 1.0 09/29/2013  0120   NITRITE NEGATIVE 02/17/2020 0508   LEUKOCYTESUR NEGATIVE 02/17/2020 5621    Radiological Exams on Admission: I have personally reviewed images US Venous Img Lower  Left (DVT Study)  Result  Date: 01/15/2023 CLINICAL DATA:  Left lower extremity swelling EXAM: LEFT LOWER EXTREMITY VENOUS DOPPLER ULTRASOUND TECHNIQUE: Gray-scale sonography with graded compression, as well as color Doppler and duplex ultrasound were performed to evaluate the lower extremity deep venous systems from the level of the common femoral vein and including the common femoral, femoral, profunda femoral, popliteal and calf veins including the posterior tibial, peroneal and gastrocnemius veins when visible. The superficial great saphenous vein was also interrogated. Spectral Doppler was utilized to evaluate flow at rest and with distal augmentation maneuvers in the common femoral, femoral and popliteal veins. COMPARISON:  None available FINDINGS: Contralateral Common Femoral Vein: Respiratory phasicity is normal and symmetric with the symptomatic side. No evidence of thrombus. Normal compressibility. Common Femoral Vein: No evidence of thrombus. Normal compressibility, respiratory phasicity and response to augmentation. Saphenofemoral Junction: No evidence of thrombus. Normal compressibility and flow on color Doppler imaging. Profunda Femoral Vein: No evidence of thrombus. Normal compressibility and flow on color Doppler imaging. Femoral Vein: No evidence of thrombus. Normal compressibility, respiratory phasicity and response to augmentation. Popliteal Vein: Acute occlusive thrombus causing decreased flow and compressibility. Calf Veins: Acute occlusive thrombus of the posterior tibial and gastrocnemius veins causing decreased flow and compressibility. Superficial Great Saphenous Vein: No evidence of thrombus. Normal compressibility. Venous Reflux:  None. Other Findings:  None. IMPRESSION: Acute DVT of the left popliteal, posterior tibial, and gastrocnemius veins. Electronically Signed   By: Acquanetta Belling M.D.   On: 01/15/2023 10:18   CT Angio Chest PE W/Cm &/Or Wo Cm  Result Date: 01/15/2023 CLINICAL DATA:  42 year old female  with history of pleuritic chest pain and right-sided back pain. Right upper quadrant abdominal pain. EXAM: CT ANGIOGRAPHY CHEST CT ABDOMEN AND PELVIS WITH CONTRAST TECHNIQUE: Multidetector CT imaging of the chest was performed using the standard protocol during bolus administration of intravenous contrast. Multiplanar CT image reconstructions and MIPs were obtained to evaluate the vascular anatomy. Multidetector CT imaging of the abdomen and pelvis was performed using the standard protocol during bolus administration of intravenous contrast. RADIATION DOSE REDUCTION: This exam was performed according to the departmental dose-optimization program which includes automated exposure control, adjustment of the mA and/or kV according to patient size and/or use of iterative reconstruction technique. CONTRAST:  OMNIPAQUE IOHEXOL 350 MG/ML SOLN COMPARISON:  CT of the abdomen and pelvis 02/17/2020. No prior chest CT. FINDINGS: CTA CHEST FINDINGS Cardiovascular: Multiple filling defects are noted in the pulmonary arterial tree bilaterally, including lobar and segmental sized filling defects in the left upper lobe, and segmental and subsegmental sized filling defects in the right lower lobe, indicative of bilateral pulmonary embolism. On the right, this appears nonocclusive at this time. Pulmonic trunk is mildly dilated measuring 3.4 cm in diameter. Estimated right ventricular diameter of 4.9 cm. Estimated left ventricular diameter of 5.3 cm. RV to LV ratio of 0.93. There is no significant pericardial fluid, thickening or pericardial calcification. No atherosclerotic calcifications are noted in the thoracic aorta or the coronary arteries. Mediastinum/Nodes: No pathologically enlarged mediastinal or hilar lymph nodes. Esophagus is unremarkable in appearance. No axillary lymphadenopathy. Lungs/Pleura: There is a small amount a of ground-glass attenuation in the basal segments of the right lower lobe near the costophrenic  sulcus, likely to reflect some alveolar hemorrhage in the setting of  pulmonary infarction. Left lung is clear. No pleural effusions. No suspicious appearing pulmonary nodules or masses are noted. Musculoskeletal: There are no aggressive appearing lytic or blastic lesions noted in the visualized portions of the skeleton. Review of the MIP images confirms the above findings. CT ABDOMEN and PELVIS FINDINGS Hepatobiliary: No suspicious cystic or solid hepatic lesions. No intra or extrahepatic biliary ductal dilatation. Status post cholecystectomy. Pancreas: No pancreatic mass. No pancreatic ductal dilatation. No pancreatic or peripancreatic fluid collections or inflammatory changes. Spleen: Unremarkable. Adrenals/Urinary Tract: Bilateral kidneys and adrenal glands are normal in appearance. No hydroureteronephrosis. Urinary bladder is normal in appearance. Stomach/Bowel: The appearance of the stomach is normal. No pathologic dilatation of small bowel or colon. Normal appendix. Vascular/Lymphatic: No significant atherosclerotic disease, aneurysm or dissection noted in the abdominal or pelvic vasculature. No lymphadenopathy noted in the abdomen or pelvis. Reproductive: Uterus and ovaries are unremarkable in appearance. Other: No significant volume of ascites.  No pneumoperitoneum. Musculoskeletal: There are no aggressive appearing lytic or blastic lesions noted in the visualized portions of the skeleton. Review of the MIP images confirms the above findings. IMPRESSION: 1. Study is positive for pulmonary embolism bilaterally. There is also small amount of what appears to be alveolar hemorrhage in the right lower lobe, likely sequela of pulmonary infarct. 2. Mild dilatation of the pulmonic trunk, which could suggest developing pulmonary arterial hypertension. 3. No acute findings are noted in the abdomen or pelvis to account for the patient's symptoms. Electronically Signed   By: Trudie Reed M.D.   On: 01/15/2023 09:55    CT ABDOMEN PELVIS W CONTRAST  Result Date: 01/15/2023 CLINICAL DATA:  42 year old female with history of pleuritic chest pain and right-sided back pain. Right upper quadrant abdominal pain. EXAM: CT ANGIOGRAPHY CHEST CT ABDOMEN AND PELVIS WITH CONTRAST TECHNIQUE: Multidetector CT imaging of the chest was performed using the standard protocol during bolus administration of intravenous contrast. Multiplanar CT image reconstructions and MIPs were obtained to evaluate the vascular anatomy. Multidetector CT imaging of the abdomen and pelvis was performed using the standard protocol during bolus administration of intravenous contrast. RADIATION DOSE REDUCTION: This exam was performed according to the departmental dose-optimization program which includes automated exposure control, adjustment of the mA and/or kV according to patient size and/or use of iterative reconstruction technique. CONTRAST:  OMNIPAQUE IOHEXOL 350 MG/ML SOLN COMPARISON:  CT of the abdomen and pelvis 02/17/2020. No prior chest CT. FINDINGS: CTA CHEST FINDINGS Cardiovascular: Multiple filling defects are noted in the pulmonary arterial tree bilaterally, including lobar and segmental sized filling defects in the left upper lobe, and segmental and subsegmental sized filling defects in the right lower lobe, indicative of bilateral pulmonary embolism. On the right, this appears nonocclusive at this time. Pulmonic trunk is mildly dilated measuring 3.4 cm in diameter. Estimated right ventricular diameter of 4.9 cm. Estimated left ventricular diameter of 5.3 cm. RV to LV ratio of 0.93. There is no significant pericardial fluid, thickening or pericardial calcification. No atherosclerotic calcifications are noted in the thoracic aorta or the coronary arteries. Mediastinum/Nodes: No pathologically enlarged mediastinal or hilar lymph nodes. Esophagus is unremarkable in appearance. No axillary lymphadenopathy. Lungs/Pleura: There is a small amount a of  ground-glass attenuation in the basal segments of the right lower lobe near the costophrenic sulcus, likely to reflect some alveolar hemorrhage in the setting of pulmonary infarction. Left lung is clear. No pleural effusions. No suspicious appearing pulmonary nodules or masses are noted. Musculoskeletal: There are no aggressive appearing lytic or  blastic lesions noted in the visualized portions of the skeleton. Review of the MIP images confirms the above findings. CT ABDOMEN and PELVIS FINDINGS Hepatobiliary: No suspicious cystic or solid hepatic lesions. No intra or extrahepatic biliary ductal dilatation. Status post cholecystectomy. Pancreas: No pancreatic mass. No pancreatic ductal dilatation. No pancreatic or peripancreatic fluid collections or inflammatory changes. Spleen: Unremarkable. Adrenals/Urinary Tract: Bilateral kidneys and adrenal glands are normal in appearance. No hydroureteronephrosis. Urinary bladder is normal in appearance. Stomach/Bowel: The appearance of the stomach is normal. No pathologic dilatation of small bowel or colon. Normal appendix. Vascular/Lymphatic: No significant atherosclerotic disease, aneurysm or dissection noted in the abdominal or pelvic vasculature. No lymphadenopathy noted in the abdomen or pelvis. Reproductive: Uterus and ovaries are unremarkable in appearance. Other: No significant volume of ascites.  No pneumoperitoneum. Musculoskeletal: There are no aggressive appearing lytic or blastic lesions noted in the visualized portions of the skeleton. Review of the MIP images confirms the above findings. IMPRESSION: 1. Study is positive for pulmonary embolism bilaterally. There is also small amount of what appears to be alveolar hemorrhage in the right lower lobe, likely sequela of pulmonary infarct. 2. Mild dilatation of the pulmonic trunk, which could suggest developing pulmonary arterial hypertension. 3. No acute findings are noted in the abdomen or pelvis to account for the  patient's symptoms. Electronically Signed   By: Trudie Reed M.D.   On: 01/15/2023 09:55    EKG: I have personally reviewed EKG: NSR  Assessment/Plan Principal Problem:   Pulmonary embolism (HCC)    Assessment and Plan: * Pulmonary embolism [facility]) Patient with recent h/o orthopedic surgery who presents with a swollen leg and mild-moderate shortness of breath. LE venous doppler reveals acute DVT popliteal, posterior tibial and gastrocnemius veins. CTA chest reeals lobar and segmental LUL and RLL PE with hemorrhage RLL c/w infarct. Patient started on IV heparin in ED  Plan Inpatient admit  Continue IV heparin for 48 hrs  Transition to NOAC after 24 hrs        DVT prophylaxis: IV heparin gtts Code Status: Full Code Family Communication: husband on his way to hospital - no call to cell phone 2/2 hazard  Disposition Plan: home when stable  Consults called: EDP put in consult to pulmonology  Admission status: Observation, Telemetry bed   Illene Regulus, MD [facility] 01/15/2023, 4:42 PM

## 2023-01-15 NOTE — ED Notes (Addendum)
Patient transported to CT 

## 2023-01-15 NOTE — Assessment & Plan Note (Signed)
Patient with recent h/o orthopedic surgery who presents with a swollen leg and mild-moderate shortness of breath. LE venous doppler reveals acute DVT popliteal, posterior tibial and gastrocnemius veins. CTA chest reeals lobar and segmental LUL and RLL PE with hemorrhage RLL c/w infarct. Patient started on IV heparin in ED  Plan Inpatient admit  Continue IV heparin for 48 hrs  Transition to NOAC after 24 hrs

## 2023-01-15 NOTE — Plan of Care (Signed)
  Problem: Health Behavior/Discharge Planning: Goal: Ability to manage health-related needs will improve Outcome: Progressing   Problem: Education: Goal: Knowledge of General Education information will improve Description: Including pain rating scale, medication(s)/side effects and non-pharmacologic comfort measures Outcome: Progressing   Problem: Nutrition: Goal: Adequate nutrition will be maintained Outcome: Progressing   Problem: Coping: Goal: Level of anxiety will decrease Outcome: Progressing

## 2023-01-15 NOTE — Progress Notes (Signed)
Plan of Care Note for accepted transfer   Patient: Kendra Fowler MRN: [id number]   DOA: 01/15/2023  Facility requesting transfer: [facility] Requesting Provider: Dr. Earlene Plater Reason for transfer: PE  Facility course: 42 year old with past medical history significant for anxiety, GERD, PCOS who presented to ED with complaints of leg pain and shortness of breath.  She had surgery on 7/12 (left knee arthroscopy and partial lateral meniscectomy and medial meniscus repair by Dr. Aundria Rud). She noticed her left leg swelling since surgery and had pleuritic chest pain that started last night.   Vitals: afebrile, bp: 119/75, HR 90, RR: 19, oxygen: 100%RA Pertinent labs: hgb: 10.8, potassium: 3.4, creatinine: 1.10, d-dimer: 4.88,  CTA chest and CT abdomen/pelvis:  Study is positive for pulmonary embolism bilaterally. There is also small amount of what appears to be alveolar hemorrhage in the right lower lobe, likely sequela of pulmonary infarct. 2. Mild dilatation of the pulmonic trunk, which could suggest developing pulmonary arterial hypertension. 3. No acute findings are noted in the abdomen or pelvis to account for the patient's symptoms. Left doppler: Acute DVT of the left popliteal, posterior tibial, and gastrocnemius veins. In ED; started on heparin gtt, pulm consulted and [facility] asked to admit. Pulm will see her as well. Recommended heparin gtt without bolus.   Plan of care: The patient is accepted for admission to Progressive unit, at [facility].. Will need echo.    Author: Orland Mustard, MD 01/15/2023  Check [email or web address] for on-call coverage.  Nursing staff, Please call [facility] Admits & Consults System-Wide number on Amion as soon as patient's arrival, so appropriate admitting provider can evaluate the pt.

## 2023-01-15 NOTE — ED Notes (Signed)
Report given to [facility] with [facility]. ETA .

## 2023-01-15 NOTE — ED Triage Notes (Signed)
Pt reports to ED with complaints of leg pain, SOB and wanting to be evaluated for blood clots. Pt had Surgery 07/12 on Left leg Pt states that the last few day she has noted some SOB  and pain when she takes a deep breath.

## 2023-01-15 NOTE — Progress Notes (Signed)
ANTICOAGULATION CONSULT NOTE Pharmacy Consult for heparin Indication: pulmonary embolus Brief A/P: Heparin level within goal range Continue Heparin at current rate   Allergies  Allergen Reactions   Lobster [Shellfish Allergy] Itching, Nausea And Vomiting and Swelling    Also, crab   Penicillins Anaphylaxis    Per patient facial and tongue swelling    Patient Measurements: Height: 5\' 7"  (170.2 cm) Weight: 132.5 kg (292 lb) IBW/kg (Calculated) : 61.6 Heparin Dosing Weight: 94kg  Vital Signs: Temp: 98 F (36.7 C) (08/10 2021) Temp Source: Oral (08/10 2021) BP: 101/64 (08/10 2021) Pulse Rate: 88 (08/10 2021)  Labs: Recent Labs    01/15/23 0819 01/15/23 1031 01/15/23 1728 01/15/23 2224  HGB 10.8*  --   --   --   HCT 34.1*  --   --   --   PLT 320  --   --   --   HEPARINUNFRC  --   --  0.36 0.35  CREATININE 1.10*  --   --   --   TROPONINIHS 3 3  --   --     Estimated Creatinine Clearance: 95.6 mL/min (A) (by C-G formula based on SCr of 1.1 mg/dL (H)).  Assessment: 42 y.o. female with PE for heparin  Goal of Therapy:  Heparin level 0.3-0.5 units/ml Monitor platelets by anticoagulation protocol: Yes   Plan:  No change to heparin  Follow-up am labs.   Geannie Risen, PharmD, BCPS

## 2023-01-15 NOTE — ED Notes (Signed)
ED Provider at bedside. 

## 2023-01-15 NOTE — Progress Notes (Addendum)
ANTICOAGULATION CONSULT NOTE - Initial Consult  Pharmacy Consult for heparin Indication: pulmonary embolus  Allergies  Allergen Reactions   Lobster [Shellfish Allergy] Itching, Nausea And Vomiting and Swelling    Also, crab   Penicillins Anaphylaxis    Per patient facial and tongue swelling    Patient Measurements: Height: 5\' 7"  (170.2 cm) Weight: 132.5 kg (292 lb) IBW/kg (Calculated) : 61.6 Heparin Dosing Weight: 94kg  Vital Signs: Temp: 99 F (37.2 C) (08/10 0812) Temp Source: Oral (08/10 0812) BP: 99/62 (08/10 0812) Pulse Rate: 100 (08/10 0812)  Labs: Recent Labs    01/15/23 0819  HGB 10.8*  HCT 34.1*  PLT 320  CREATININE 1.10*  TROPONINIHS 3    Estimated Creatinine Clearance: 95.6 mL/min (A) (by C-G formula based on SCr of 1.1 mg/dL (H)).   Medical History: Past Medical History:  Diagnosis Date   Anemia    Anxiety    Arthritis    Complication of anesthesia    took a long time waking up after her cholecystectomy   Dysrhythmia    Palpitations   Gallbladder attack    GERD (gastroesophageal reflux disease)    Neuromuscular disorder (HCC)    nerve pain down Left leg,   Ovarian cyst    PCOS (polycystic ovarian syndrome)    Thyroid disease    Thyroid nodules,  monitored by endocrinology    Assessment: 27 YOF presenting with SOB, CT with PE and alveolar hemorrhage likely sequela of PE.  She is not on anticoagulation PTA.  Discussed with MD and will target low end goals, no bolus considering alveolar hemorrhage  Goal of Therapy:  Heparin level 0.3-0.5 units/ml Monitor platelets by anticoagulation protocol: Yes   Plan:  Heparin gtt at 1500 units/hr, no bolus F/u 6 hour heparin level F/u long term [facility] plan  Daylene Posey, PharmD, [facility] Clinical Pharmacist ED Pharmacist Phone # [phone] 01/15/2023 10:24 AM

## 2023-01-15 NOTE — Subjective & Objective (Signed)
Ms. Kendra Fowler, a 42 y/o with a relatively benign medical history with no active medical problems underwent orthopedic knee surgery about 1 month ago. She developed swelling of the left calf and also became mildly short of breath. She presented to [facility] for evaluation where she was found by U/S to have Acute DVTs left popliteal,posterior tibial and gastrocnemius veins; CTA chest revealed  lobar and segmental LUL and RLL PE with possible hemorrhage RLL. She was started on IV heparin and transferred to [facility] for continued treatment.

## 2023-01-15 NOTE — ED Notes (Signed)
Attempted IV access to LAC, tol well, unsuccessful

## 2023-01-16 ENCOUNTER — Observation Stay (HOSPITAL_COMMUNITY): Payer: BC Managed Care – PPO

## 2023-01-16 DIAGNOSIS — F419 Anxiety disorder, unspecified: Secondary | ICD-10-CM | POA: Diagnosis present

## 2023-01-16 DIAGNOSIS — I82462 Acute embolism and thrombosis of left calf muscular vein: Secondary | ICD-10-CM

## 2023-01-16 DIAGNOSIS — Z6841 Body Mass Index (BMI) 40.0 and over, adult: Secondary | ICD-10-CM | POA: Diagnosis not present

## 2023-01-16 DIAGNOSIS — I2699 Other pulmonary embolism without acute cor pulmonale: Secondary | ICD-10-CM

## 2023-01-16 DIAGNOSIS — Z7985 Long-term (current) use of injectable non-insulin antidiabetic drugs: Secondary | ICD-10-CM | POA: Diagnosis not present

## 2023-01-16 DIAGNOSIS — Z9889 Other specified postprocedural states: Secondary | ICD-10-CM | POA: Diagnosis not present

## 2023-01-16 DIAGNOSIS — Z9049 Acquired absence of other specified parts of digestive tract: Secondary | ICD-10-CM | POA: Diagnosis not present

## 2023-01-16 DIAGNOSIS — R079 Chest pain, unspecified: Secondary | ICD-10-CM

## 2023-01-16 DIAGNOSIS — E282 Polycystic ovarian syndrome: Secondary | ICD-10-CM | POA: Diagnosis present

## 2023-01-16 DIAGNOSIS — Y92009 Unspecified place in unspecified non-institutional (private) residence as the place of occurrence of the external cause: Secondary | ICD-10-CM | POA: Diagnosis not present

## 2023-01-16 DIAGNOSIS — Z79899 Other long term (current) drug therapy: Secondary | ICD-10-CM | POA: Diagnosis not present

## 2023-01-16 DIAGNOSIS — T39016A Underdosing of aspirin, initial encounter: Secondary | ICD-10-CM | POA: Diagnosis present

## 2023-01-16 DIAGNOSIS — Z91128 Patient's intentional underdosing of medication regimen for other reason: Secondary | ICD-10-CM | POA: Diagnosis not present

## 2023-01-16 DIAGNOSIS — Z91013 Allergy to seafood: Secondary | ICD-10-CM | POA: Diagnosis not present

## 2023-01-16 DIAGNOSIS — K219 Gastro-esophageal reflux disease without esophagitis: Secondary | ICD-10-CM | POA: Diagnosis present

## 2023-01-16 DIAGNOSIS — D509 Iron deficiency anemia, unspecified: Secondary | ICD-10-CM | POA: Diagnosis present

## 2023-01-16 DIAGNOSIS — Z880 Allergy status to penicillin: Secondary | ICD-10-CM | POA: Diagnosis not present

## 2023-01-16 DIAGNOSIS — I82432 Acute embolism and thrombosis of left popliteal vein: Secondary | ICD-10-CM | POA: Diagnosis present

## 2023-01-16 DIAGNOSIS — I82442 Acute embolism and thrombosis of left tibial vein: Secondary | ICD-10-CM | POA: Diagnosis present

## 2023-01-16 DIAGNOSIS — Z803 Family history of malignant neoplasm of breast: Secondary | ICD-10-CM | POA: Diagnosis not present

## 2023-01-16 LAB — BASIC METABOLIC PANEL WITH GFR
Anion gap: 6 (ref 5–15)
BUN: 10 mg/dL (ref 6–20)
CO2: 27 mmol/L (ref 22–32)
Calcium: 8.5 mg/dL — ABNORMAL LOW (ref 8.9–10.3)
Chloride: 101 mmol/L (ref 98–111)
Creatinine, Ser: 1.12 mg/dL — ABNORMAL HIGH (ref 0.44–1.00)
GFR, Estimated: >60 mL/min (ref >60)
Glucose, Bld: 91 mg/dL (ref 70–99)
Potassium: 3.6 mmol/L (ref 3.5–5.1)
Sodium: 134 mmol/L — ABNORMAL LOW (ref 135–145)

## 2023-01-16 LAB — CBC
HCT: 31.7 % — ABNORMAL LOW (ref 36.0–46.0)
Hemoglobin: 9.6 g/dL — ABNORMAL LOW (ref 12.0–15.0)
MCH: 24.9 pg — ABNORMAL LOW (ref 26.0–34.0)
MCHC: 30.3 g/dL (ref 30.0–36.0)
MCV: 82.1 fL (ref 80.0–100.0)
Platelets: 295 10*3/uL (ref 150–400)
RBC: 3.86 MIL/uL — ABNORMAL LOW (ref 3.87–5.11)
RDW: 15.8 % — ABNORMAL HIGH (ref 11.5–15.5)
WBC: 7.1 10*3/uL (ref 4.0–10.5)
nRBC: 0 % (ref 0.0–0.2)

## 2023-01-16 LAB — FERRITIN: Ferritin: 37 ng/mL (ref 11–307)

## 2023-01-16 LAB — IRON AND TIBC
Iron: 19 ug/dL — ABNORMAL LOW (ref 28–170)
Saturation Ratios: 7 % — ABNORMAL LOW (ref 10.4–31.8)
TIBC: 258 ug/dL (ref 250–450)
UIBC: 239 ug/dL

## 2023-01-16 LAB — HEPARIN LEVEL (UNFRACTIONATED): Heparin Unfractionated: 0.42 [IU]/mL (ref 0.30–0.70)

## 2023-01-16 LAB — ECHOCARDIOGRAM COMPLETE
AR max vel: 2.86 cm2
AV Area VTI: 2.73 cm2
AV Area mean vel: 2.73 cm2
AV Mean grad: 5 mmHg
AV Peak grad: 8.9 mmHg
Ao pk vel: 1.49 m/s
Area-P 1/2: 4.1 cm2
Height: 67 in
S' Lateral: 3.1 cm
Weight: 4672 oz

## 2023-01-16 LAB — RETICULOCYTES
Immature Retic Fract: 14.6 % (ref 2.3–15.9)
RBC.: 3.79 MIL/uL — ABNORMAL LOW (ref 3.87–5.11)
Retic Count, Absolute: 62.9 10*3/uL (ref 19.0–186.0)
Retic Ct Pct: 1.7 % (ref 0.4–3.1)

## 2023-01-16 LAB — PROTIME-INR
INR: 1.1 (ref 0.8–1.2)
Prothrombin Time: 14.5 s (ref 11.4–15.2)

## 2023-01-16 LAB — FOLATE: Folate: 7 ng/mL (ref >5.9)

## 2023-01-16 LAB — VITAMIN B12: Vitamin B-12: 340 pg/mL (ref 180–914)

## 2023-01-16 LAB — HIV ANTIBODY (ROUTINE TESTING W REFLEX): HIV Screen 4th Generation wRfx: NONREACTIVE

## 2023-01-16 MED ORDER — PERFLUTREN LIPID MICROSPHERE
1.0000 mL | INTRAVENOUS | Status: AC | PRN
  Administered 2023-01-16: 2 mL via INTRAVENOUS

## 2023-01-16 NOTE — Progress Notes (Signed)
PROGRESS NOTE  Kendra Fowler GNF:[id number] DOB: 1981-04-04   PCP: Jonna Clark, MD  Patient is from: Home.  DOA: 01/15/2023 LOS: 0  Chief complaints Chief Complaint  Patient presents with   Leg Pain     Brief Narrative / Interim history: 42 year old F with PMH of morbid obesity who had left knee surgery about a month ago and discharged on aspirin 81 mg twice daily for VTE prophylaxis returning with right leg swelling and pain, and admitted for right lower extremity DVT and PE.  CT angio chest showed lobar and segmental LUL and RLL PE with possible RLL infarct.  LE venous Doppler positive for acute DVT in left popliteal, posterior tibial and gastrocnemius veins.  Apparently she did not start taking aspirin for 2 days after discharge.  She noted some deep calf pain about 2 weeks ago.  She was hemodynamically stable without oxygen requirement.  Started on IV heparin and admitted for further care.  TTE pending.  Subjective: Seen and examined earlier this morning.  No major events overnight of this morning.  Reports pain in right leg.  Denies shortness of breath or chest pain.  Objective: Vitals:   01/16/23 0052 01/16/23 0543 01/16/23 0700 01/16/23 1158  BP: 113/63 114/72 116/69 113/81  Pulse: 74 72 74 76  Resp: 15 16 17 20   Temp: 97.9 F (36.6 C) 98.2 F (36.8 C) 98.6 F (37 C) 98.7 F (37.1 C)  TempSrc: Oral Oral Oral Oral  SpO2: 97% 98% 94% 100%  Weight:      Height:        Examination:  GENERAL: No apparent distress.  Nontoxic. HEENT: MMM.  Vision and hearing grossly intact.  NECK: Supple.  No apparent JVD.  RESP:  No IWOB.  Fair aeration bilaterally. CVS:  RRR. Heart sounds normal.  ABD/GI/GU: BS+. Abd soft, NTND.  MSK/EXT:  Moves extremities.  RLE swelling.  Surgical wounds healed. SKIN: As above. NEURO: Awake, alert and oriented appropriately.  No apparent focal neuro deficit. PSYCH: Calm. Normal affect.   Procedures:  None  Microbiology  summarized: MRSA PCR screen nonreactive.  Assessment and plan: Principal Problem:   Pulmonary embolism (HCC)  Acute pulmonary embolism/acute left lower extremity DVT: Likely provoked in the setting of recent left knee surgery, immobility and not starting DVT prophylaxis early.  Patient was advised to take ASA 81 mg twice daily for VTE prophylaxis that she has not started for 2 days after discharge.  Hemodynamically stable.  BNP and troponin negative.  No oxygen requirement. -Follow TTE-if negative, discharged on starter pack Eliquis.  Needs anticoagulation for 6 months  Normocytic anemia: Relatively stable Recent Labs    11/30/22 1025 01/15/23 0819 01/16/23 0542  HGB 11.7* 10.8* 9.6*  -Check anemia panel  Recent left knee surgery: Surgical wound healed nicely. -Resume home health or discharge.  Morbid obesity Body mass index is 45.73 kg/m. -Encourage lifestyle change to lose weight.          DVT prophylaxis:  On full dose anticoagulation.  Code Status: Full code Family Communication: None at bedside Level of care: Progressive Status is: Observation The patient will require care spanning > 2 midnights and should be moved to inpatient because: Acute PE/DVT   Final disposition: Home Consultants:  None  35 minutes with more than 50% spent in reviewing records, counseling patient/family and coordinating care.   Sch Meds:  Scheduled Meds:  ketorolac  30 mg Intravenous Q6H   senna  1 tablet Oral BID  Continuous Infusions:  heparin 1,500 Units/hr (01/16/23 0400)   PRN Meds:.acetaminophen **OR** acetaminophen, zolpidem  Antimicrobials: Anti-infectives (From admission, onward)    None        I have personally reviewed the following labs and images: CBC: Recent Labs  Lab 01/15/23 0819 01/16/23 0542  WBC 8.5 7.1  NEUTROABS 5.9  --   HGB 10.8* 9.6*  HCT 34.1* 31.7*  MCV 80.6 82.1  PLT 320 295   BMP &GFR Recent Labs  Lab 01/15/23 0819  01/16/23 0542  NA 137 134*  K 3.4* 3.6  CL 103 101  CO2 27 27  GLUCOSE 84 91  BUN 12 10  CREATININE 1.10* 1.12*  CALCIUM 8.9 8.5*   Estimated Creatinine Clearance: 93.9 mL/min (A) (by C-G formula based on SCr of 1.12 mg/dL (H)). Liver & Pancreas: Recent Labs  Lab 01/15/23 0819  AST 14*  ALT 13  ALKPHOS 82  BILITOT 0.5  PROT 7.1  ALBUMIN 3.4*   Recent Labs  Lab 01/15/23 0821  LIPASE 22   No results for input(s): "AMMONIA" in the last 168 hours. Diabetic: No results for input(s): "HGBA1C" in the last 72 hours. No results for input(s): "GLUCAP" in the last 168 hours. Cardiac Enzymes: No results for input(s): "CKTOTAL", "CKMB", "CKMBINDEX", "TROPONINI" in the last 168 hours. No results for input(s): "PROBNP" in the last 8760 hours. Coagulation Profile: Recent Labs  Lab 01/16/23 0542  INR 1.1   Thyroid Function Tests: No results for input(s): "TSH", "T4TOTAL", "FREET4", "T3FREE", "THYROIDAB" in the last 72 hours. Lipid Profile: No results for input(s): "CHOL", "HDL", "LDLCALC", "TRIG", "CHOLHDL", "LDLDIRECT" in the last 72 hours. Anemia Panel: No results for input(s): "VITAMINB12", "FOLATE", "FERRITIN", "TIBC", "IRON", "RETICCTPCT" in the last 72 hours. Urine analysis:    Component Value Date/Time   COLORURINE YELLOW 02/17/2020 0508   APPEARANCEUR CLEAR 02/17/2020 0508   LABSPEC 1.020 02/17/2020 0508   PHURINE 5.5 02/17/2020 0508   GLUCOSEU NEGATIVE 02/17/2020 0508   HGBUR SMALL (A) 02/17/2020 0508   BILIRUBINUR NEGATIVE 02/17/2020 0508   KETONESUR NEGATIVE 02/17/2020 0508   PROTEINUR NEGATIVE 02/17/2020 0508   UROBILINOGEN 1.0 09/29/2013 0120   NITRITE NEGATIVE 02/17/2020 0508   LEUKOCYTESUR NEGATIVE 02/17/2020 0508   Sepsis Labs: Invalid input(s): "PROCALCITONIN", "LACTICIDVEN"  Microbiology: Recent Results (from the past 240 hour(s))  MRSA Next Gen by PCR, Nasal     Status: None   Collection Time: 01/15/23  6:58 PM   Specimen: Nasal Mucosa; Nasal  Swab  Result Value Ref Range Status   MRSA by PCR Next Gen NOT DETECTED NOT DETECTED Final    Comment: (NOTE) The GeneXpert MRSA Assay (FDA approved for NASAL specimens only), is one component of a comprehensive MRSA colonization surveillance program. It is not intended to diagnose MRSA infection nor to guide or monitor treatment for MRSA infections. Test performance is not FDA approved in patients less than 38 years old. Performed at [facility], [address] [place], [place] [address]     Radiology Studies: No results found.    Pristine Gladhill T. Ger Ringenberg [facility]  If 7PM-7AM, please contact night-coverage [email or web address] 01/16/2023, 2:26 PM

## 2023-01-16 NOTE — Progress Notes (Signed)
ANTICOAGULATION CONSULT NOTE  Pharmacy Consult for heparin Indication: pulmonary embolus  Allergies  Allergen Reactions   Lobster [Shellfish Allergy] Itching, Nausea And Vomiting and Swelling    Also, crab   Penicillins Anaphylaxis    Per patient facial and tongue swelling    Patient Measurements: Height: 5\' 7"  (170.2 cm) Weight: 132.5 kg (292 lb) IBW/kg (Calculated) : 61.6 Heparin Dosing Weight: 94kg  Vital Signs: Temp: 98.7 F (37.1 C) (08/11 1158) Temp Source: Oral (08/11 1158) BP: 113/81 (08/11 1158) Pulse Rate: 76 (08/11 1158)  Labs: Recent Labs    01/15/23 0819 01/15/23 1031 01/15/23 1728 01/15/23 2224 01/16/23 0542  HGB 10.8*  --   --   --  9.6*  HCT 34.1*  --   --   --  31.7*  PLT 320  --   --   --  295  LABPROT  --   --   --   --  14.5  INR  --   --   --   --  1.1  HEPARINUNFRC  --   --  0.36 0.35 0.42  CREATININE 1.10*  --   --   --  1.12*  TROPONINIHS 3 3  --   --   --     Estimated Creatinine Clearance: 93.9 mL/min (A) (by C-G formula based on SCr of 1.12 mg/dL (H)).  Assessment: 59 YOF presenting with SOB, CT with PE and alveolar hemorrhage likely sequela of PE.  She is not on anticoagulation PTA.  Discussed with MD and will target low end goals, no bolus considering alveolar hemorrhage.  Heparin level is therapeutic at 0.42 on heparin drip 1500 units/hr. Hgb low at 10.8, platelets are normal. No issues with bleeding or infusion per RN.  Goal of Therapy:  Heparin level 0.3-0.5 units/ml Monitor platelets by anticoagulation protocol: Yes   Plan:  Continue heparin gtt at 1500 units/hr Daily heparin level, CBC Monitor for s/sx of bleeding May transition to DOAC after 24h   Leota Sauers Pharm.D. CPP, BCPS Clinical Pharmacist [phone] 01/16/2023 2:34 PM

## 2023-01-17 ENCOUNTER — Other Ambulatory Visit (HOSPITAL_COMMUNITY): Payer: Self-pay

## 2023-01-17 ENCOUNTER — Other Ambulatory Visit (HOSPITAL_BASED_OUTPATIENT_CLINIC_OR_DEPARTMENT_OTHER): Payer: Self-pay

## 2023-01-17 DIAGNOSIS — I2699 Other pulmonary embolism without acute cor pulmonale: Secondary | ICD-10-CM | POA: Diagnosis not present

## 2023-01-17 LAB — RENAL FUNCTION PANEL
Albumin: 2.7 g/dL — ABNORMAL LOW (ref 3.5–5.0)
Anion gap: 7 (ref 5–15)
BUN: 11 mg/dL (ref 6–20)
CO2: 26 mmol/L (ref 22–32)
Calcium: 8.5 mg/dL — ABNORMAL LOW (ref 8.9–10.3)
Chloride: 101 mmol/L (ref 98–111)
Creatinine, Ser: 1.09 mg/dL — ABNORMAL HIGH (ref 0.44–1.00)
GFR, Estimated: >60 mL/min
Glucose, Bld: 93 mg/dL (ref 70–99)
Phosphorus: 2.9 mg/dL (ref 2.5–4.6)
Potassium: 3.5 mmol/L (ref 3.5–5.1)
Sodium: 134 mmol/L — ABNORMAL LOW (ref 135–145)

## 2023-01-17 LAB — HEPARIN LEVEL (UNFRACTIONATED): Heparin Unfractionated: 0.42 [IU]/mL (ref 0.30–0.70)

## 2023-01-17 LAB — MAGNESIUM: Magnesium: 1.9 mg/dL (ref 1.7–2.4)

## 2023-01-17 MED ORDER — APIXABAN 5 MG PO TABS
10.0000 mg | ORAL_TABLET | Freq: Two times a day (BID) | ORAL | Status: DC
  Administered 2023-01-17: 10 mg via ORAL
  Filled 2023-01-17: qty 2

## 2023-01-17 MED ORDER — FERROUS SULFATE 325 (65 FE) MG PO TABS
325.0000 mg | ORAL_TABLET | Freq: Two times a day (BID) | ORAL | 1 refills | Status: AC
  Filled 2023-01-17: qty 100, 50d supply, fill #0

## 2023-01-17 MED ORDER — APIXABAN 5 MG PO TABS: 5.0000 mg | ORAL_TABLET | Freq: Two times a day (BID) | ORAL | Status: DC

## 2023-01-17 MED ORDER — APIXABAN (ELIQUIS) VTE STARTER PACK (10MG AND 5MG)
ORAL_TABLET | ORAL | 0 refills | Status: AC
  Filled 2023-01-17: qty 74, 30d supply, fill #0

## 2023-01-17 MED ORDER — SODIUM CHLORIDE 0.9 % IV SOLN: 250.0000 mg | Freq: Every day | INTRAVENOUS | Status: DC

## 2023-01-17 MED ORDER — APIXABAN 5 MG PO TABS: 5.0000 mg | ORAL_TABLET | Freq: Two times a day (BID) | ORAL | 2 refills | Status: AC

## 2023-01-17 MED ORDER — SODIUM CHLORIDE 0.9 % IV SOLN
250.0000 mg | Freq: Every day | INTRAVENOUS | Status: DC
  Administered 2023-01-17: 250 mg via INTRAVENOUS
  Filled 2023-01-17: qty 20

## 2023-01-17 MED ORDER — SENNOSIDES-DOCUSATE SODIUM 8.6-50 MG PO TABS: 1.0000 | ORAL_TABLET | Freq: Two times a day (BID) | ORAL | Status: AC | PRN

## 2023-01-17 NOTE — Discharge Instructions (Signed)
Information on my medicine - ELIQUIS (apixaban)  Why was Eliquis prescribed for you? Eliquis was prescribed to treat blood clots that may have been found in the veins of your legs (deep vein thrombosis) or in your lungs (pulmonary embolism) and to reduce the risk of them occurring again.  What do You need to know about Eliquis ? The starting dose is 10 mg (two 5 mg tablets) taken TWICE daily for the FIRST SEVEN (7) DAYS, then on 01/24/23  the dose is reduced to ONE 5 mg tablet taken TWICE daily.  Eliquis may be taken with or without food.   Try to take the dose about the same time in the morning and in the evening. If you have difficulty swallowing the tablet whole please discuss with your pharmacist how to take the medication safely.  Take Eliquis exactly as prescribed and DO NOT stop taking Eliquis without talking to the doctor who prescribed the medication.  Stopping may increase your risk of developing a new blood clot.  Refill your prescription before you run out.  After discharge, you should have regular check-up appointments with your healthcare provider that is prescribing your Eliquis.    What do you do if you miss a dose? If a dose of ELIQUIS is not taken at the scheduled time, take it as soon as possible on the same day and twice-daily administration should be resumed. The dose should not be doubled to make up for a missed dose.  Important Safety Information A possible side effect of Eliquis is bleeding. You should call your healthcare provider right away if you experience any of the following: Bleeding from an injury or your nose that does not stop. Unusual colored urine (red or dark brown) or unusual colored stools (red or black). Unusual bruising for unknown reasons. A serious fall or if you hit your head (even if there is no bleeding).  Some medicines may interact with Eliquis and might increase your risk of bleeding or clotting while on Eliquis. To help avoid this,  consult your healthcare provider or pharmacist prior to using any new prescription or non-prescription medications, including herbals, vitamins, non-steroidal anti-inflammatory drugs (NSAIDs) and supplements.  This website has more information on Eliquis (apixaban): http://[email or web address].com/eliquis/home

## 2023-01-17 NOTE — Progress Notes (Signed)
Patient provided with verbal discharge instructions. Paper copy of discharge provided to patient. RN answered all questions. VSS at discharge. IV removed. Eliquis brought to beside by [facility] pharmacy. Patient belongings sent with patient. Patient dc'd via wheelchair to private vehicle

## 2023-01-17 NOTE — TOC Benefit Eligibility Note (Signed)
Pharmacy Patient [facility] verification completed.    The patient is insured through [facility] and medicaid     Ran test claim for Eliquis and the current 30 day co-pay is $0.00.   Ran test claim for Xarelto and the current 30 day co-pay is $0.00.  This test claim was processed through [facility]- copay amounts may vary at other pharmacies due to pharmacy/plan contracts, or as the patient moves through the different stages of their insurance plan.

## 2023-01-17 NOTE — Progress Notes (Signed)
Delivered patient TOC to the patient's bedside with his nurse present.

## 2023-01-17 NOTE — Progress Notes (Signed)
Mobility Specialist Progress Note:   01/17/23 1400  Pain Assessment  Pain Assessment No/denies pain  Mobility  Activity Ambulated with assistance in hallway;Ambulated with assistance in room  Level of Assistance Contact guard assist, steadying assist  Assistive Device Front wheel walker  Distance Ambulated (ft) 30 ft  Activity Response Tolerated well  Mobility Referral Yes  $Mobility charge 1 Mobility  Mobility Specialist Start Time (ACUTE ONLY) 1403  Mobility Specialist Stop Time (ACUTE ONLY) 1434  Mobility Specialist Time Calculation (min) (ACUTE ONLY) 31 min    Pre Mobility: 107 HR  Pt received in bed, eager to mobility. Wears brace on LLE. MinA for STS, using contact guard and increased time for ambulation. Asymptomatic throughout, only c/o BUE fatigue from using RW. Pt left on EOB with call bell and all needs met.  Kendra Fowler Mobility Specialist Please contact via Special educational needs teacher or Rehab office at [phone]

## 2023-01-17 NOTE — Evaluation (Signed)
Physical Therapy Evaluation Patient Details Name: Kendra Fowler MRN: [id number] DOB: [birth year] Today's Date: 01/17/2023  History of Present Illness  42 yo female [facility]10 with chest pain. 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 S/p L knee arthroscopy and partial lateral meniscectomy and medial meniscus repair on 7/12. PMH includes GERD, anxiety, anemia.  Clinical Impression   Pt presents with min LLE pain, min intermittent bilat chest wall discomfort (none present during this session), increased time and effort to mobilize. Pt to benefit from acute PT to address deficits. Pt ambulated hallway distance with use of crutches and increased time, pt states she is moving more slowly than normal but otherwise WFL and maintains WB precautions well. HR tachycardic up to 122 bpm, no dyspnea on exertion. Pt to continue OPPT at d/c.          If plan is discharge home, recommend the following: A little help with walking and/or transfers   Can travel by private vehicle        Equipment Recommendations None recommended by PT  Recommendations for Other Services       Functional Status Assessment Patient has not had a recent decline in their functional status     Precautions / Restrictions Precautions Precautions: Knee Precaution Booklet Issued: No Required Braces or Orthoses: Knee Immobilizer - Left Knee Immobilizer - Left: On when out of bed or walking Restrictions Weight Bearing Restrictions: Yes LLE Weight Bearing: Partial weight bearing LLE Partial Weight Bearing Percentage or Pounds: per patient report 50% with device Other Position/Activity Restrictions: bledsoe brace locked in extension, states she does bicycle at PT but at home is 100% locked in extension      Mobility  Bed Mobility               General bed mobility comments: sitting EOB    Transfers Overall transfer level: Needs assistance Equipment used:  Crutches Transfers: Sit to/from Stand Sit to Stand: Supervision           General transfer comment: for safety, pt maintains WB precautions LLE    Ambulation/Gait Ambulation/Gait assistance: Supervision Gait Distance (Feet): 100 Feet Assistive device: Crutches Gait Pattern/deviations: Step-to pattern, Decreased step length - left, Decreased stance time - left, Antalgic, Trunk flexed Gait velocity: decr     General Gait Details: for safety, cues for sequencing with crutches  Stairs Stairs:  (reviewed sequencing (up with the good leg, down with the bad leg))          Wheelchair Mobility     Tilt Bed    Modified Rankin (Stroke Patients Only)       Balance Overall balance assessment: Mild deficits observed, not formally tested                                           Pertinent Vitals/Pain Pain Assessment Pain Assessment: Faces Faces Pain Scale: Hurts a little bit Pain Location: L knee, patella area Pain Descriptors / Indicators: Discomfort Pain Intervention(s): Limited activity within patient's tolerance, Monitored during session, Repositioned    Home Living Family/patient expects to be discharged to:: Private residence Living Arrangements: Spouse/significant other;Children Available Help at Discharge: Available 24 hours/day Type of Home: House Home Access: Stairs to enter Entrance Stairs-Rails: None Entrance Stairs-Number of Steps: one step from patio, one step into the house Alternate  Level Stairs-Number of Steps: 14 Home Layout: Two level;Bed/bath upstairs;1/2 bath on main level Home Equipment: Wheelchair - manual;Crutches      Prior Function Prior Level of Function : Independent/Modified Independent;Working/employed;Driving (works from home)             Mobility Comments: independent, using crutches post knee surgery and navigating with wheelchair, ADLs Comments: independent     Extremity/Trunk Assessment   Upper  Extremity Assessment Upper Extremity Assessment: Defer to OT evaluation    Lower Extremity Assessment Lower Extremity Assessment: LLE deficits/detail LLE Deficits / Details: in bledsoe brace in extension, able to perform quad set    Cervical / Trunk Assessment Cervical / Trunk Assessment: Normal  Communication   Communication Communication: No apparent difficulties  Cognition Arousal: Alert Behavior During Therapy: WFL for tasks assessed/performed Overall Cognitive Status: Within Functional Limits for tasks assessed                                          General Comments General comments (skin integrity, edema, etc.): VSS    Exercises  Home walking program: up and walking 1x/hour during waking hours for short household distances with supervision of family, to promote circulation, activity tolerance, and strength maintenance.     Assessment/Plan    PT Assessment All further PT needs can be met in the next venue of care  PT Problem List Decreased strength;Decreased balance;Decreased activity tolerance;Decreased range of motion;Decreased safety awareness;Decreased mobility;Decreased knowledge of precautions;Cardiopulmonary status limiting activity;Decreased coordination       PT Treatment Interventions      PT Goals (Current goals can be found in the Care Plan section)  Acute Rehab PT Goals PT Goal Formulation: All assessment and education complete, DC therapy Time For Goal Achievement: 01/17/23 Potential to Achieve Goals: Good    Frequency       Co-evaluation               AM-PAC PT "6 Clicks" Mobility  Outcome Measure Help needed turning from your back to your side while in a flat bed without using bedrails?: None Help needed moving from lying on your back to sitting on the side of a flat bed without using bedrails?: None Help needed moving to and from a bed to a chair (including a wheelchair)?: None Help needed standing up from a chair  using your arms (e.g., wheelchair or bedside chair)?: None Help needed to walk in hospital room?: A Little Help needed climbing 3-5 steps with a railing? : A Little 6 Click Score: 22    End of Session Equipment Utilized During Treatment: Left knee immobilizer Activity Tolerance: Patient tolerated treatment well Patient left: in bed;with call bell/phone within reach;with family/visitor present Nurse Communication: Mobility status PT Visit Diagnosis: Other abnormalities of gait and mobility (R26.89);Muscle weakness (generalized) (M62.81);Pain Pain - part of body: Leg    Time: 1500-1525 PT Time Calculation (min) (ACUTE ONLY): 25 min   Charges:   PT Evaluation $PT Eval Low Complexity: 1 Low PT Treatments $Therapeutic Activity: 8-22 mins PT General Charges $$ ACUTE PT VISIT: 1 Visit         Marye Round, PT DPT Acute Rehabilitation Services Secure Chat Preferred  Office [phone]   Cire Deyarmin Sheliah Plane 01/17/2023, 4:17 PM

## 2023-01-17 NOTE — TOC Transition Note (Signed)
Transition of Care [facility]) - CM/SW Discharge Note   Patient Details  Name: Kendra Fowler MRN: [id number] Date of Birth: January 19, 1981  Transition of Care [facility]) CM/SW Contact:  Harriet Masson, RN Phone Number: 01/17/2023, 11:50 AM   Clinical Narrative:    Patient stable for discharge.  Patient will resume OP rehab.  No other TOC needs at this time.   Final next level of care: OP Rehab Barriers to Discharge: Barriers Resolved   Patient Goals and CMS Choice  Return home    Discharge Placement                 home        Discharge Plan and Services Additional resources added to the After Visit Summary for                                       Social Determinants of Health (SDOH) Interventions SDOH Screenings   Tobacco Use: Low Risk  (01/15/2023)     Readmission Risk Interventions    01/17/2023   11:50 AM  Readmission Risk Prevention Plan  Post Dischage Appt Complete  Medication Screening Complete  Transportation Screening Complete

## 2023-01-17 NOTE — Progress Notes (Signed)
ANTICOAGULATION CONSULT NOTE  Pharmacy Consult for heparin> apixaban Indication: pulmonary embolus  Allergies  Allergen Reactions   Lobster [Shellfish Allergy] Itching, Nausea And Vomiting and Swelling    Also, crab   Penicillins Anaphylaxis    Per patient facial and tongue swelling    Patient Measurements: Height: 5\' 7"  (170.2 cm) Weight: 132.5 kg (292 lb) IBW/kg (Calculated) : 61.6 Heparin Dosing Weight: 94kg  Vital Signs: Temp: 97.6 F (36.4 C) (08/12 0734) Temp Source: Oral (08/12 0734) BP: 125/76 (08/12 0734) Pulse Rate: 78 (08/12 0734)  Labs: Recent Labs    01/15/23 0819 01/15/23 1031 01/15/23 1728 01/15/23 2224 01/16/23 0542 01/17/23 0120  HGB 10.8*  --   --   --  9.6* 9.7*  HCT 34.1*  --   --   --  31.7* 30.9*  PLT 320  --   --   --  295 281  LABPROT  --   --   --   --  14.5  --   INR  --   --   --   --  1.1  --   HEPARINUNFRC  --   --    < > 0.35 0.42 0.42  CREATININE 1.10*  --   --   --  1.12* 1.09*  TROPONINIHS 3 3  --   --   --   --    < > = values in this interval not displayed.    Estimated Creatinine Clearance: 96.5 mL/min (A) (by C-G formula based on SCr of 1.09 mg/dL (H)).  Assessment: 24 YOF presenting with SOB, CT with PE and alveolar hemorrhage likely sequela of PE.  She is not on anticoagulation PTA.    -heparin level at goal this morning, CBC stable -plans are to transition to apixaban (cost= $0)   Goal of Therapy:  Heparin level 0.3-0.5 units/ml Monitor platelets by anticoagulation protocol: Yes   Plan:  -Discontinue heparin  -apixaban 10mg  po bid for 7 days followed by 5mg  po bid  Harland German, PharmD Clinical Pharmacist **Pharmacist phone directory can now be found on [email or web address] ([email or web address]).  Listed under [facility].

## 2023-01-17 NOTE — Discharge Summary (Signed)
Physician Discharge Summary  Kendra Fowler AOZ:[id number] DOB: 1980/10/24 DOA: 01/15/2023  PCP: Jonna Clark, MD  Admit date: 01/15/2023 Discharge date: 01/17/2023 Admitted From: Home Disposition: Home Recommendations for Outpatient Follow-up:  Follow up with PCP in 1 week Check CBC and CMP in 1 week Please follow up on the following pending results: None  Home Health: Resume outpatient therapy Equipment/Devices: Not required  Discharge Condition: Stable CODE STATUS: Full code  Follow-up Information     Jonna Clark, MD. Schedule an appointment as soon as possible for a visit in 1 week(s).   Specialty: Internal Medicine Contact information: [address] [place] [address] [phone]                 Hospital course42 year old F with PMH of morbid obesity who had left knee surgery about a month ago and discharged on aspirin 81 mg twice daily for VTE prophylaxis returning with right leg swelling and pain, and admitted for right lower extremity DVT and PE.  CT angio chest showed lobar and segmental LUL and RLL PE with possible RLL infarct.  LE venous Doppler positive for acute DVT in left popliteal, posterior tibial and gastrocnemius veins.  Apparently she did not start taking aspirin for 2 days after discharge.  She noted some deep calf pain about 2 weeks ago.  She was hemodynamically stable without oxygen requirement.  Started on IV heparin and admitted for further care.   Echocardiogram without significant finding.  Patient remained stable without oxygen requirement.  Transitioned to starter pack Eliquis and discharged.  Recommend repeat CBC in 1 week.  Evaluated by therapy prior to discharge.  No need identified.  See individual problem list below for more.   Problems addressed during this hospitalization Principal Problem:   Pulmonary embolism (HCC) Active Problems:   Acute pulmonary embolism (HCC)   Acute pulmonary  embolism/acute left lower extremity DVT: Likely provoked in the setting of recent left knee surgery, immobility and not starting DVT prophylaxis early.  Patient was advised to take ASA 81 mg twice daily for VTE prophylaxis that she has not started for 2 days after discharge.  Hemodynamically stable.  BNP and troponin negative.  No oxygen requirement.  -Follow TTE-if negative, discharged on starter pack Eliquis.  Needs anticoagulation for 6 months   Iron deficiency anemia: Anemia panel with iron deficiency.  H&H stable. -Received IV ferric gluconate 250 mg x 1 prior to discharge -P.o. iron -Repeat CBC in 1 week   Recent left knee surgery: Surgical wound healed nicely. -Continue outpatient therapy -Outpatient follow-up with orthopedic surgery             Time spent 35 minutes  Vital signs Vitals:   01/16/23 2359 01/17/23 0612 01/17/23 0734 01/17/23 1255  BP: 120/83 Comment: Simultaneous filing. User may not have seen previous data. 123/70 125/76 134/81  Pulse: 93 Comment: Simultaneous filing. User may not have seen previous data.  78 86  Temp: 97.7 F (36.5 C) Comment: Simultaneous filing. User may not have seen previous data. 97.7 F (36.5 C) 97.6 F (36.4 C) 98.2 F (36.8 C)  Resp: (!) 27 Comment: Simultaneous filing. User may not have seen previous data. 20 16 20   Height:      Weight:      SpO2: 98% Comment: Simultaneous filing. User may not have seen previous data.  98% 99%  TempSrc: Oral Comment: Simultaneous filing. User may not have seen previous data. Oral Oral Oral  BMI (Calculated):  Discharge exam  GENERAL: No apparent distress.  Nontoxic. HEENT: MMM.  Vision and hearing grossly intact.  NECK: Supple.  No apparent JVD.  RESP:  No IWOB.  Fair aeration bilaterally. CVS:  RRR. Heart sounds normal.  ABD/GI/GU: BS+. Abd soft, NTND.  MSK/EXT:  Moves extremities.  RLE swelling.  Surgical wounds healed. SKIN: As above. NEURO: Awake, alert and oriented  appropriately.  No apparent focal neuro deficit. PSYCH: Calm. Normal affect.   Discharge Instructions Discharge Instructions     Diet - low sodium heart healthy   Complete by: As directed    Discharge instructions   Complete by: As directed    It has been a pleasure taking care of you!  You were hospitalized due to acute pulmonary embolism (blood clot in your lungs) and acute DVT (blood clot in your left leg) for which you have been started on blood thinner.  We are discharging you on Eliquis (oral blood thinner).  It is very important that you take this medication as prescribed.  We strongly recommend you avoid any over-the-counter pain medication other than plain Tylenol while taking Eliquis.  Please review your new medication list and the directions on your medications before you take them.  Follow-up with your primary care doctor in 1 to 2 weeks or sooner if needed.  Follow-up with your orthopedic surgeon as previously planned.   Take care,   Increase activity slowly   Complete by: As directed       Allergies as of 01/17/2023       Reactions   Lobster [shellfish Allergy] Itching, Nausea And Vomiting, Swelling   Also, crab   Penicillins Anaphylaxis   Per patient facial and tongue swelling        Medication List     STOP taking these medications    ibuprofen 800 MG tablet Commonly known as: ADVIL   naproxen 500 MG tablet Commonly known as: NAPROSYN       TAKE these medications    clindamycin-tretinoin gel Commonly known as: ZIANA Apply 1 Application topically at bedtime.   Eliquis DVT/PE Starter Pack Generic drug: Apixaban Starter Pack (10mg  and 5mg ) Take as directed on package: start with two-5mg  tablets twice daily for 7 days. On day 8, switch to one-5mg  tablet twice daily.   apixaban 5 MG Tabs tablet Commonly known as: ELIQUIS Take 1 tablet (5 mg total) by mouth 2 (two) times daily. Start this after you complete starter pack Start taking on: February 16, 2023   FeroSul 325 (65 Fe) MG tablet Generic drug: ferrous sulfate Take 1 tablet (325 mg total) by mouth 2 (two) times daily.   furosemide 20 MG tablet Commonly known as: LASIX Take 1 tablet (20 mg total) by mouth daily.   metoprolol tartrate 25 MG tablet Commonly known as: LOPRESSOR Take 12.5 mg by mouth 2 (two) times daily.   multivitamins ther. w/minerals Tabs tablet Take 1 tablet by mouth daily.   senna-docusate 8.6-50 MG tablet Commonly known as: Senokot-S Take 1 tablet by mouth 2 (two) times daily between meals as needed for mild constipation.   spironolactone 25 MG tablet Commonly known as: ALDACTONE Take 25 mg by mouth daily.   Vitamin D (Ergocalciferol) 1.25 MG (50000 UNIT) Caps capsule Commonly known as: DRISDOL Take 50,000 Units by mouth every 7 (seven) days.   Zepbound 12.5 MG/0.5ML Pen Generic drug: tirzepatide Inject 12.5 mg into the skin once a week.        Consultations: None  Procedures/Studies:  ECHOCARDIOGRAM COMPLETE  Result Date: 01/16/2023    ECHOCARDIOGRAM REPORT   Patient Name:   SAREYA SALINGER Date of Exam: 01/16/2023 Medical Rec #:  [id number]            Height:       67.0 in Accession #:    [id number]           Weight:       292.0 lb Date of Birth:  01-Nov-1980            BSA:          2.375 m Patient Age:    42 years             BP:           113/81 mmHg Patient Gender: F                    HR:           84 bpm. Exam Location:  Inpatient Procedure: 2D Echo, Color Doppler, Cardiac Doppler and Intracardiac            Opacification Agent Indications:    PE  History:        Patient has no prior history of Echocardiogram examinations.                 PE.  Sonographer:    Milbert Coulter Referring Phys: Candelaria Stagers, T IMPRESSIONS  1. Left ventricular ejection fraction, by estimation, is 60 to 65%. The left ventricle has normal function. The left ventricle has no regional wall motion abnormalities. There is mild concentric left ventricular  hypertrophy. Left ventricular diastolic parameters are indeterminate.  2. Right ventricular systolic function is normal. The right ventricular size is normal.  3. The mitral valve is normal in structure. No evidence of mitral valve regurgitation. No evidence of mitral stenosis.  4. The aortic valve is normal in structure. Aortic valve regurgitation is not visualized. No aortic stenosis is present.  5. The inferior vena cava is normal in size with greater than 50% respiratory variability, suggesting right atrial pressure of 3 mmHg. FINDINGS  Left Ventricle: Left ventricular ejection fraction, by estimation, is 60 to 65%. The left ventricle has normal function. The left ventricle has no regional wall motion abnormalities. The left ventricular internal cavity size was normal in size. There is  mild concentric left ventricular hypertrophy. Left ventricular diastolic parameters are indeterminate. Right Ventricle: The right ventricular size is normal. No increase in right ventricular wall thickness. Right ventricular systolic function is normal. Left Atrium: Left atrial size was normal in size. Right Atrium: Right atrial size was normal in size. Pericardium: There is no evidence of pericardial effusion. Mitral Valve: The mitral valve is normal in structure. No evidence of mitral valve regurgitation. No evidence of mitral valve stenosis. Tricuspid Valve: The tricuspid valve is normal in structure. Tricuspid valve regurgitation is mild . No evidence of tricuspid stenosis. Aortic Valve: The aortic valve is normal in structure. Aortic valve regurgitation is not visualized. No aortic stenosis is present. Aortic valve mean gradient measures 5.0 mmHg. Aortic valve peak gradient measures 8.9 mmHg. Aortic valve area, by VTI measures 2.73 cm. Pulmonic Valve: The pulmonic valve was normal in structure. Pulmonic valve regurgitation is not visualized. No evidence of pulmonic stenosis. Aorta: The aortic root is normal in size and  structure. Venous: The inferior vena cava is normal in size with greater than 50% respiratory variability, suggesting right atrial pressure of 3  mmHg. IAS/Shunts: No atrial level shunt detected by color flow Doppler.  LEFT VENTRICLE PLAX 2D LVIDd:         5.10 cm   Diastology LVIDs:         3.10 cm   LV e' medial:    12.90 cm/s LV PW:         1.20 cm   LV E/e' medial:  5.9 LV IVS:        1.20 cm   LV e' lateral:   11.70 cm/s LVOT diam:     2.10 cm   LV E/e' lateral: 6.5 LV SV:         71 LV SV Index:   30 LVOT Area:     3.46 cm  RIGHT VENTRICLE RV S prime:     16.90 cm/s TAPSE (M-mode): 2.6 cm LEFT ATRIUM             Index        RIGHT ATRIUM           Index LA diam:        4.20 cm 1.77 cm/m   RA Area:     18.80 cm LA Vol (A2C):   59.6 ml 25.09 ml/m  RA Volume:   52.50 ml  22.10 ml/m LA Vol (A4C):   43.9 ml 18.48 ml/m LA Biplane Vol: 52.1 ml 21.94 ml/m  AORTIC VALVE AV Area (Vmax):    2.86 cm AV Area (Vmean):   2.73 cm AV Area (VTI):     2.73 cm AV Vmax:           149.00 cm/s AV Vmean:          101.000 cm/s AV VTI:            0.261 m AV Peak Grad:      8.9 mmHg AV Mean Grad:      5.0 mmHg LVOT Vmax:         123.00 cm/s LVOT Vmean:        79.700 cm/s LVOT VTI:          0.206 m LVOT/AV VTI ratio: 0.79  AORTA Ao Root diam: 2.70 cm Ao Asc diam:  2.70 cm MITRAL VALVE MV Area (PHT): 4.10 cm    SHUNTS MV Decel Time: 185 msec    Systemic VTI:  0.21 m MV E velocity: 76.50 cm/s  Systemic Diam: 2.10 cm MV A velocity: 63.40 cm/s MV E/A ratio:  1.21 Kardie Tobb DO Electronically signed by Thomasene Ripple DO Signature Date/Time: 01/16/2023/4:25:17 PM    Final    US Venous Img Lower  Left (DVT Study)  Result Date: 01/15/2023 CLINICAL DATA:  Left lower extremity swelling EXAM: LEFT LOWER EXTREMITY VENOUS DOPPLER ULTRASOUND TECHNIQUE: Gray-scale sonography with graded compression, as well as color Doppler and duplex ultrasound were performed to evaluate the lower extremity deep venous systems from the level of the common  femoral vein and including the common femoral, femoral, profunda femoral, popliteal and calf veins including the posterior tibial, peroneal and gastrocnemius veins when visible. The superficial great saphenous vein was also interrogated. Spectral Doppler was utilized to evaluate flow at rest and with distal augmentation maneuvers in the common femoral, femoral and popliteal veins. COMPARISON:  None available FINDINGS: Contralateral Common Femoral Vein: Respiratory phasicity is normal and symmetric with the symptomatic side. No evidence of thrombus. Normal compressibility. Common Femoral Vein: No evidence of thrombus. Normal compressibility, respiratory phasicity and response to augmentation. Saphenofemoral Junction: No evidence of  thrombus. Normal compressibility and flow on color Doppler imaging. Profunda Femoral Vein: No evidence of thrombus. Normal compressibility and flow on color Doppler imaging. Femoral Vein: No evidence of thrombus. Normal compressibility, respiratory phasicity and response to augmentation. Popliteal Vein: Acute occlusive thrombus causing decreased flow and compressibility. Calf Veins: Acute occlusive thrombus of the posterior tibial and gastrocnemius veins causing decreased flow and compressibility. Superficial Great Saphenous Vein: No evidence of thrombus. Normal compressibility. Venous Reflux:  None. Other Findings:  None. IMPRESSION: Acute DVT of the left popliteal, posterior tibial, and gastrocnemius veins. Electronically Signed   By: Acquanetta Belling M.D.   On: 01/15/2023 10:18   CT Angio Chest PE W/Cm &/Or Wo Cm  Result Date: 01/15/2023 CLINICAL DATA:  42 year old female with history of pleuritic chest pain and right-sided back pain. Right upper quadrant abdominal pain. EXAM: CT ANGIOGRAPHY CHEST CT ABDOMEN AND PELVIS WITH CONTRAST TECHNIQUE: Multidetector CT imaging of the chest was performed using the standard protocol during bolus administration of intravenous contrast. Multiplanar  CT image reconstructions and MIPs were obtained to evaluate the vascular anatomy. Multidetector CT imaging of the abdomen and pelvis was performed using the standard protocol during bolus administration of intravenous contrast. RADIATION DOSE REDUCTION: This exam was performed according to the departmental dose-optimization program which includes automated exposure control, adjustment of the mA and/or kV according to patient size and/or use of iterative reconstruction technique. CONTRAST:  OMNIPAQUE IOHEXOL 350 MG/ML SOLN COMPARISON:  CT of the abdomen and pelvis 02/17/2020. No prior chest CT. FINDINGS: CTA CHEST FINDINGS Cardiovascular: Multiple filling defects are noted in the pulmonary arterial tree bilaterally, including lobar and segmental sized filling defects in the left upper lobe, and segmental and subsegmental sized filling defects in the right lower lobe, indicative of bilateral pulmonary embolism. On the right, this appears nonocclusive at this time. Pulmonic trunk is mildly dilated measuring 3.4 cm in diameter. Estimated right ventricular diameter of 4.9 cm. Estimated left ventricular diameter of 5.3 cm. RV to LV ratio of 0.93. There is no significant pericardial fluid, thickening or pericardial calcification. No atherosclerotic calcifications are noted in the thoracic aorta or the coronary arteries. Mediastinum/Nodes: No pathologically enlarged mediastinal or hilar lymph nodes. Esophagus is unremarkable in appearance. No axillary lymphadenopathy. Lungs/Pleura: There is a small amount a of ground-glass attenuation in the basal segments of the right lower lobe near the costophrenic sulcus, likely to reflect some alveolar hemorrhage in the setting of pulmonary infarction. Left lung is clear. No pleural effusions. No suspicious appearing pulmonary nodules or masses are noted. Musculoskeletal: There are no aggressive appearing lytic or blastic lesions noted in the visualized portions of the skeleton.  Review of the MIP images confirms the above findings. CT ABDOMEN and PELVIS FINDINGS Hepatobiliary: No suspicious cystic or solid hepatic lesions. No intra or extrahepatic biliary ductal dilatation. Status post cholecystectomy. Pancreas: No pancreatic mass. No pancreatic ductal dilatation. No pancreatic or peripancreatic fluid collections or inflammatory changes. Spleen: Unremarkable. Adrenals/Urinary Tract: Bilateral kidneys and adrenal glands are normal in appearance. No hydroureteronephrosis. Urinary bladder is normal in appearance. Stomach/Bowel: The appearance of the stomach is normal. No pathologic dilatation of small bowel or colon. Normal appendix. Vascular/Lymphatic: No significant atherosclerotic disease, aneurysm or dissection noted in the abdominal or pelvic vasculature. No lymphadenopathy noted in the abdomen or pelvis. Reproductive: Uterus and ovaries are unremarkable in appearance. Other: No significant volume of ascites.  No pneumoperitoneum. Musculoskeletal: There are no aggressive appearing lytic or blastic lesions noted in the visualized portions of the  skeleton. Review of the MIP images confirms the above findings. IMPRESSION: 1. Study is positive for pulmonary embolism bilaterally. There is also small amount of what appears to be alveolar hemorrhage in the right lower lobe, likely sequela of pulmonary infarct. 2. Mild dilatation of the pulmonic trunk, which could suggest developing pulmonary arterial hypertension. 3. No acute findings are noted in the abdomen or pelvis to account for the patient's symptoms. Electronically Signed   By: Trudie Reed M.D.   On: 01/15/2023 09:55   CT ABDOMEN PELVIS W CONTRAST  Result Date: 01/15/2023 CLINICAL DATA:  42 year old female with history of pleuritic chest pain and right-sided back pain. Right upper quadrant abdominal pain. EXAM: CT ANGIOGRAPHY CHEST CT ABDOMEN AND PELVIS WITH CONTRAST TECHNIQUE: Multidetector CT imaging of the chest was performed  using the standard protocol during bolus administration of intravenous contrast. Multiplanar CT image reconstructions and MIPs were obtained to evaluate the vascular anatomy. Multidetector CT imaging of the abdomen and pelvis was performed using the standard protocol during bolus administration of intravenous contrast. RADIATION DOSE REDUCTION: This exam was performed according to the departmental dose-optimization program which includes automated exposure control, adjustment of the mA and/or kV according to patient size and/or use of iterative reconstruction technique. CONTRAST:  OMNIPAQUE IOHEXOL 350 MG/ML SOLN COMPARISON:  CT of the abdomen and pelvis 02/17/2020. No prior chest CT. FINDINGS: CTA CHEST FINDINGS Cardiovascular: Multiple filling defects are noted in the pulmonary arterial tree bilaterally, including lobar and segmental sized filling defects in the left upper lobe, and segmental and subsegmental sized filling defects in the right lower lobe, indicative of bilateral pulmonary embolism. On the right, this appears nonocclusive at this time. Pulmonic trunk is mildly dilated measuring 3.4 cm in diameter. Estimated right ventricular diameter of 4.9 cm. Estimated left ventricular diameter of 5.3 cm. RV to LV ratio of 0.93. There is no significant pericardial fluid, thickening or pericardial calcification. No atherosclerotic calcifications are noted in the thoracic aorta or the coronary arteries. Mediastinum/Nodes: No pathologically enlarged mediastinal or hilar lymph nodes. Esophagus is unremarkable in appearance. No axillary lymphadenopathy. Lungs/Pleura: There is a small amount a of ground-glass attenuation in the basal segments of the right lower lobe near the costophrenic sulcus, likely to reflect some alveolar hemorrhage in the setting of pulmonary infarction. Left lung is clear. No pleural effusions. No suspicious appearing pulmonary nodules or masses are noted. Musculoskeletal: There are no  aggressive appearing lytic or blastic lesions noted in the visualized portions of the skeleton. Review of the MIP images confirms the above findings. CT ABDOMEN and PELVIS FINDINGS Hepatobiliary: No suspicious cystic or solid hepatic lesions. No intra or extrahepatic biliary ductal dilatation. Status post cholecystectomy. Pancreas: No pancreatic mass. No pancreatic ductal dilatation. No pancreatic or peripancreatic fluid collections or inflammatory changes. Spleen: Unremarkable. Adrenals/Urinary Tract: Bilateral kidneys and adrenal glands are normal in appearance. No hydroureteronephrosis. Urinary bladder is normal in appearance. Stomach/Bowel: The appearance of the stomach is normal. No pathologic dilatation of small bowel or colon. Normal appendix. Vascular/Lymphatic: No significant atherosclerotic disease, aneurysm or dissection noted in the abdominal or pelvic vasculature. No lymphadenopathy noted in the abdomen or pelvis. Reproductive: Uterus and ovaries are unremarkable in appearance. Other: No significant volume of ascites.  No pneumoperitoneum. Musculoskeletal: There are no aggressive appearing lytic or blastic lesions noted in the visualized portions of the skeleton. Review of the MIP images confirms the above findings. IMPRESSION: 1. Study is positive for pulmonary embolism bilaterally. There is also small amount of what  appears to be alveolar hemorrhage in the right lower lobe, likely sequela of pulmonary infarct. 2. Mild dilatation of the pulmonic trunk, which could suggest developing pulmonary arterial hypertension. 3. No acute findings are noted in the abdomen or pelvis to account for the patient's symptoms. Electronically Signed   By: Trudie Reed M.D.   On: 01/15/2023 09:55       The results of significant diagnostics from this hospitalization (including imaging, microbiology, ancillary and laboratory) are listed below for reference.     Microbiology: Recent Results (from the past 240  hour(s))  MRSA Next Gen by PCR, Nasal     Status: None   Collection Time: 01/15/23  6:58 PM   Specimen: Nasal Mucosa; Nasal Swab  Result Value Ref Range Status   MRSA by PCR Next Gen NOT DETECTED NOT DETECTED Final    Comment: (NOTE) The GeneXpert MRSA Assay (FDA approved for NASAL specimens only), is one component of a comprehensive MRSA colonization surveillance program. It is not intended to diagnose MRSA infection nor to guide or monitor treatment for MRSA infections. Test performance is not FDA approved in patients less than 70 years old. Performed at [facility], [address], [place], [place] [address]      Labs:  CBC: Recent Labs  Lab 01/15/23 [phone] 01/16/23 0542 01/17/23 0120  WBC 8.5 7.1 8.4  NEUTROABS 5.9  --   --   HGB 10.8* 9.6* 9.7*  HCT 34.1* 31.7* 30.9*  MCV 80.6 82.1 82.6  PLT 320 295 281   BMP &GFR Recent Labs  Lab 01/15/23 0819 01/16/23 0542 01/17/23 0120  NA 137 134* 134*  K 3.4* 3.6 3.5  CL 103 101 101  CO2 27 27 26   GLUCOSE 84 91 93  BUN 12 10 11   CREATININE 1.10* 1.12* 1.09*  CALCIUM 8.9 8.5* 8.5*  MG  --   --  1.9  PHOS  --   --  2.9   Estimated Creatinine Clearance: 96.5 mL/min (A) (by C-G formula based on SCr of 1.09 mg/dL (H)). Liver & Pancreas: Recent Labs  Lab 01/15/23 0819 01/17/23 0120  AST 14*  --   ALT 13  --   ALKPHOS 82  --   BILITOT 0.5  --   PROT 7.1  --   ALBUMIN 3.4* 2.7*   Recent Labs  Lab 01/15/23 0821  LIPASE 22   No results for input(s): "AMMONIA" in the last 168 hours. Diabetic: No results for input(s): "HGBA1C" in the last 72 hours. No results for input(s): "GLUCAP" in the last 168 hours. Cardiac Enzymes: No results for input(s): "CKTOTAL", "CKMB", "CKMBINDEX", "TROPONINI" in the last 168 hours. No results for input(s): "PROBNP" in the last 8760 hours. Coagulation Profile: Recent Labs  Lab 01/16/23 0542  INR 1.1   Thyroid Function Tests: No results for input(s): "TSH", "T4TOTAL",  "FREET4", "T3FREE", "THYROIDAB" in the last 72 hours. Lipid Profile: No results for input(s): "CHOL", "HDL", "LDLCALC", "TRIG", "CHOLHDL", "LDLDIRECT" in the last 72 hours. Anemia Panel: Recent Labs    01/16/23 0541  VITAMINB12 340  FOLATE 7.0  FERRITIN 37  TIBC 258  IRON 19*  RETICCTPCT 1.7   Urine analysis:    Component Value Date/Time   COLORURINE YELLOW 02/17/2020 0508   APPEARANCEUR CLEAR 02/17/2020 0508   LABSPEC 1.020 02/17/2020 0508   PHURINE 5.5 02/17/2020 0508   GLUCOSEU NEGATIVE 02/17/2020 0508   HGBUR SMALL (A) 02/17/2020 0508   BILIRUBINUR NEGATIVE 02/17/2020 0508   KETONESUR NEGATIVE  02/17/2020 0508   PROTEINUR NEGATIVE 02/17/2020 0508   UROBILINOGEN 1.0 09/29/2013 0120   NITRITE NEGATIVE 02/17/2020 0508   LEUKOCYTESUR NEGATIVE 02/17/2020 0508   Sepsis Labs: Invalid input(s): "PROCALCITONIN", "LACTICIDVEN"   SIGNED:  Almon Hercules, MD  [facility] 01/17/2023, 5:13 PM

## 2023-01-17 NOTE — Plan of Care (Signed)

## 2023-01-17 NOTE — Evaluation (Signed)
Occupational Therapy Evaluation Patient Details Name: Kendra Fowler MRN: [id number] DOB: 03-Jun-1981 Today's Date: 01/17/2023   History of Present Illness 42 yo female presents to [facility] on 8/10 with chest pain. 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 S/p L knee arthroscopy and partial lateral meniscectomy and medial meniscus repair on 7/12. PMH includes GERD, anxiety, anemia.   Clinical Impression   Prior to this admission, patient was recovering from meniscectomy and medial meniscus repair. Patient prior to surgery for the knee was independent, driving, and working from home. Currently, Patient presenting with need for min A in order to complete ADLs, however reports that she is at her baseline post surgery. Patient handed off to PT at end of session, therefore please refer to PT note for full assessment of mobility. Patient with no DME needs, and will resume OPPT at discharge. Patient has no need for further OT services, OT will sign off at this time.       If plan is discharge home, recommend the following: A little help with walking and/or transfers;A little help with bathing/dressing/bathroom;Assist for transportation;Help with stairs or ramp for entrance    Functional Status Assessment  Patient has had a recent decline in their functional status and demonstrates the ability to make significant improvements in function in a reasonable and predictable amount of time.  Equipment Recommendations  None recommended by OT (patient has DME needed)    Recommendations for Other Services       Precautions / Restrictions Precautions Precautions: Knee Precaution Booklet Issued: No Required Braces or Orthoses: Knee Immobilizer - Left Knee Immobilizer - Left: On when out of bed or walking Restrictions Weight Bearing Restrictions: Yes LLE Weight Bearing: Partial weight bearing LLE Partial Weight Bearing Percentage or Pounds: per patient report 50%  with device Other Position/Activity Restrictions: brace is still in extension      Mobility Bed Mobility               General bed mobility comments: sitting EOB upon OT arrival    Transfers Overall transfer level: Needs assistance                 General transfer comment: refer to PT note      Balance Overall balance assessment: Mild deficits observed, not formally tested                                         ADL either performed or assessed with clinical judgement   ADL Overall ADL's : Needs assistance/impaired Eating/Feeding: Set up;Sitting   Grooming: Set up;Sitting   Upper Body Bathing: Contact guard assist;Sitting   Lower Body Bathing: Minimal assistance;Sitting/lateral leans;Sit to/from stand   Upper Body Dressing : Set up;Sitting   Lower Body Dressing: Minimal assistance;Sitting/lateral leans;Sit to/from stand   Toilet Transfer: Minimal assistance;Ambulation;Regular Toilet;Rolling walker (2 wheels)   Toileting- Clothing Manipulation and Hygiene: Minimal assistance;Sit to/from stand;Sitting/lateral lean       Functional mobility during ADLs: Minimal assistance;Rolling walker (2 wheels) General ADL Comments: Patient presenting with need for min A in order to complete ADLs, however reports that she is at her baseline post surgery. Patient handed off to PT at end of session, therefore please refer to PT note for full assessment of mobility. Patient with no DME needs, and will resume OPPT at discharge. Patient has no need for  further OT services, OT will sign off at this time.     Vision Baseline Vision/History: 0 No visual deficits Ability to See in Adequate Light: 0 Adequate Patient Visual Report: No change from baseline Vision Assessment?: No apparent visual deficits     Perception         Praxis         Pertinent Vitals/Pain Pain Assessment Pain Assessment: No/denies pain     Extremity/Trunk Assessment Upper  Extremity Assessment Upper Extremity Assessment: Overall WFL for tasks assessed   Lower Extremity Assessment Lower Extremity Assessment: Defer to PT evaluation   Cervical / Trunk Assessment Cervical / Trunk Assessment: Normal   Communication Communication Communication: No apparent difficulties   Cognition Arousal: Alert Behavior During Therapy: WFL for tasks assessed/performed Overall Cognitive Status: Within Functional Limits for tasks assessed                                       General Comments  VSS    Exercises     Shoulder Instructions      Home Living Family/patient expects to be discharged to:: Private residence Living Arrangements: Spouse/significant other;Children Available Help at Discharge: Available 24 hours/day Type of Home: House Home Access: Stairs to enter [employer] of Steps: one step from patio, one step into the house Entrance Stairs-Rails: None Home Layout: Two level;Bed/bath upstairs;1/2 bath on main level Alternate Level Stairs-Number of Steps: 14 Alternate Level Stairs-Rails: Right Bathroom Shower/Tub: Producer, television/film/video: Standard Bathroom Accessibility: Yes   Home Equipment: Wheelchair - manual;Crutches          Prior Functioning/Environment Prior Level of Function : Independent/Modified Independent;Working/employed;Driving (works from home)             Mobility Comments: independent, using crutches post knee surgery and navigating with wheelchair, ADLs Comments: independent        OT Problem List: Decreased strength;Decreased range of motion;Decreased activity tolerance;Pain      OT Treatment/Interventions:      OT Goals(Current goals can be found in the care plan section) Acute Rehab OT Goals Patient Stated Goal: to go home OT Goal Formulation: With patient Time For Goal Achievement: 01/31/23 Potential to Achieve Goals: Good  OT Frequency:      Co-evaluation               AM-PAC OT "6 Clicks" Daily Activity     Outcome Measure Help from another person eating meals?: None Help from another person taking care of personal grooming?: None Help from another person toileting, which includes using toliet, bedpan, or urinal?: A Little Help from another person bathing (including washing, rinsing, drying)?: A Little Help from another person to put on and taking off regular upper body clothing?: None Help from another person to put on and taking off regular lower body clothing?: A Little 6 Click Score: 21   End of Session Equipment Utilized During Treatment: Left knee immobilizer Nurse Communication: Mobility status  Activity Tolerance: Patient tolerated treatment well Patient left: in bed;Other (comment) (sitting EOB with PT present)  OT Visit Diagnosis: Unsteadiness on feet (R26.81);Pain;Other abnormalities of gait and mobility (R26.89) Pain - Right/Left: Left Pain - part of body: Knee                Time: 1443-1455 OT Time Calculation (min): 12 min Charges:  OT General Charges $OT Visit: 1 Visit OT Evaluation $OT  Eval Moderate Complexity: 1 Mod  Pollyann Glen E. Avangeline Stockburger, OTR/L Acute Rehabilitation Services [phone]   Cherlyn Cushing 01/17/2023, 3:15 PM

## 2023-01-18 ENCOUNTER — Other Ambulatory Visit (HOSPITAL_BASED_OUTPATIENT_CLINIC_OR_DEPARTMENT_OTHER): Payer: Self-pay

## 2023-02-04 ENCOUNTER — Other Ambulatory Visit (HOSPITAL_BASED_OUTPATIENT_CLINIC_OR_DEPARTMENT_OTHER): Payer: Self-pay

## 2023-02-04 MED ORDER — ZEPBOUND 15 MG/0.5ML ~~LOC~~ SOAJ
15.0000 mg | SUBCUTANEOUS | 0 refills | Status: AC
  Filled 2023-02-04: qty 6, 84d supply, fill #0

## 2023-02-04 MED ORDER — FUROSEMIDE 20 MG PO TABS
20.0000 mg | ORAL_TABLET | Freq: Every day | ORAL | 0 refills | Status: AC
  Filled 2023-02-04 – 2023-03-10 (×2): qty 90, 90d supply, fill #0

## 2023-03-11 ENCOUNTER — Other Ambulatory Visit (HOSPITAL_BASED_OUTPATIENT_CLINIC_OR_DEPARTMENT_OTHER): Payer: Self-pay

## 2023-03-14 ENCOUNTER — Other Ambulatory Visit (HOSPITAL_BASED_OUTPATIENT_CLINIC_OR_DEPARTMENT_OTHER): Payer: Self-pay

## 2023-03-14 MED ORDER — ERGOCALCIFEROL 1.25 MG (50000 UT) PO CAPS
50000.0000 [IU] | ORAL_CAPSULE | ORAL | 5 refills | Status: AC
  Filled 2023-03-14: qty 4, 28d supply, fill #0
  Filled 2023-06-16 – 2023-06-22 (×2): qty 4, 28d supply, fill #1
  Filled 2023-11-16: qty 4, 28d supply, fill #2
  Filled 2023-12-19: qty 4, 28d supply, fill #3

## 2023-03-14 MED ORDER — ZEPBOUND 10 MG/0.5ML ~~LOC~~ SOAJ
10.0000 mg | SUBCUTANEOUS | 0 refills | Status: DC
  Filled 2023-03-14: qty 2, 28d supply, fill #0

## 2023-04-12 ENCOUNTER — Other Ambulatory Visit (HOSPITAL_BASED_OUTPATIENT_CLINIC_OR_DEPARTMENT_OTHER): Payer: Self-pay

## 2023-04-12 MED ORDER — ZEPBOUND 10 MG/0.5ML ~~LOC~~ SOAJ
10.0000 mg | SUBCUTANEOUS | 0 refills | Status: AC
Start: 2023-04-12 — End: ?
  Filled 2023-04-12: qty 2, 28d supply, fill #0
  Filled 2023-06-16 – 2023-10-18 (×3): qty 2, 28d supply, fill #1
  Filled 2023-10-18: qty 4, 56d supply, fill #1

## 2023-04-12 MED ORDER — VITAMIN D (ERGOCALCIFEROL) 1.25 MG (50000 UNIT) PO CAPS
50000.0000 [IU] | ORAL_CAPSULE | ORAL | 0 refills | Status: AC
  Filled 2023-04-12: qty 12, 84d supply, fill #0

## 2023-05-12 ENCOUNTER — Other Ambulatory Visit (HOSPITAL_BASED_OUTPATIENT_CLINIC_OR_DEPARTMENT_OTHER): Payer: Self-pay

## 2023-05-12 MED ORDER — SILVER SULFADIAZINE 1 % EX CREA
1.0000 | TOPICAL_CREAM | Freq: Two times a day (BID) | CUTANEOUS | 0 refills | Status: AC
  Filled 2023-05-12: qty 50, 25d supply, fill #0

## 2023-05-12 MED ORDER — TRETINOIN 0.025 % EX CREA
1.0000 | TOPICAL_CREAM | Freq: Every day | CUTANEOUS | 0 refills | Status: DC
  Filled 2023-05-12: qty 45, 30d supply, fill #0

## 2023-05-12 MED ORDER — ZEPBOUND 15 MG/0.5ML ~~LOC~~ SOAJ
15.0000 mg | SUBCUTANEOUS | 0 refills | Status: DC
  Filled 2023-05-12: qty 6, 84d supply, fill #0

## 2023-05-13 ENCOUNTER — Other Ambulatory Visit (HOSPITAL_BASED_OUTPATIENT_CLINIC_OR_DEPARTMENT_OTHER): Payer: Self-pay

## 2023-05-18 ENCOUNTER — Other Ambulatory Visit (HOSPITAL_BASED_OUTPATIENT_CLINIC_OR_DEPARTMENT_OTHER): Payer: Self-pay

## 2023-05-18 MED ORDER — ZEPBOUND 15 MG/0.5ML ~~LOC~~ SOAJ
15.0000 mg | SUBCUTANEOUS | 0 refills | Status: DC
  Filled 2023-05-18 (×2): qty 2, 28d supply, fill #0

## 2023-06-17 ENCOUNTER — Other Ambulatory Visit (HOSPITAL_BASED_OUTPATIENT_CLINIC_OR_DEPARTMENT_OTHER): Payer: Self-pay

## 2023-06-20 ENCOUNTER — Other Ambulatory Visit (HOSPITAL_BASED_OUTPATIENT_CLINIC_OR_DEPARTMENT_OTHER): Payer: Self-pay

## 2023-06-20 ENCOUNTER — Other Ambulatory Visit: Payer: Self-pay

## 2023-06-20 MED ORDER — FUROSEMIDE 20 MG PO TABS
20.0000 mg | ORAL_TABLET | Freq: Every day | ORAL | 0 refills | Status: DC
  Filled 2023-06-20: qty 90, 90d supply, fill #0

## 2023-06-21 ENCOUNTER — Other Ambulatory Visit (HOSPITAL_BASED_OUTPATIENT_CLINIC_OR_DEPARTMENT_OTHER): Payer: Self-pay

## 2023-06-21 MED ORDER — ZEPBOUND 15 MG/0.5ML ~~LOC~~ SOAJ
15.0000 mg | SUBCUTANEOUS | 0 refills | Status: DC
  Filled 2023-06-21 – 2023-06-22 (×2): qty 2, 28d supply, fill #0

## 2023-06-22 ENCOUNTER — Other Ambulatory Visit: Payer: Self-pay

## 2023-06-22 ENCOUNTER — Other Ambulatory Visit (HOSPITAL_BASED_OUTPATIENT_CLINIC_OR_DEPARTMENT_OTHER): Payer: Self-pay

## 2023-07-04 ENCOUNTER — Other Ambulatory Visit (HOSPITAL_BASED_OUTPATIENT_CLINIC_OR_DEPARTMENT_OTHER): Payer: Self-pay

## 2023-07-06 ENCOUNTER — Other Ambulatory Visit (HOSPITAL_BASED_OUTPATIENT_CLINIC_OR_DEPARTMENT_OTHER): Payer: Self-pay

## 2023-07-07 ENCOUNTER — Other Ambulatory Visit (HOSPITAL_BASED_OUTPATIENT_CLINIC_OR_DEPARTMENT_OTHER): Payer: Self-pay

## 2023-07-08 ENCOUNTER — Other Ambulatory Visit (HOSPITAL_BASED_OUTPATIENT_CLINIC_OR_DEPARTMENT_OTHER): Payer: Self-pay

## 2023-07-11 ENCOUNTER — Other Ambulatory Visit (HOSPITAL_BASED_OUTPATIENT_CLINIC_OR_DEPARTMENT_OTHER): Payer: Self-pay

## 2023-07-13 ENCOUNTER — Other Ambulatory Visit (HOSPITAL_BASED_OUTPATIENT_CLINIC_OR_DEPARTMENT_OTHER): Payer: Self-pay

## 2023-07-14 ENCOUNTER — Other Ambulatory Visit (HOSPITAL_BASED_OUTPATIENT_CLINIC_OR_DEPARTMENT_OTHER): Payer: Self-pay

## 2023-07-15 ENCOUNTER — Other Ambulatory Visit (HOSPITAL_BASED_OUTPATIENT_CLINIC_OR_DEPARTMENT_OTHER): Payer: Self-pay

## 2023-07-18 ENCOUNTER — Other Ambulatory Visit (HOSPITAL_BASED_OUTPATIENT_CLINIC_OR_DEPARTMENT_OTHER): Payer: Self-pay

## 2023-07-19 ENCOUNTER — Other Ambulatory Visit (HOSPITAL_BASED_OUTPATIENT_CLINIC_OR_DEPARTMENT_OTHER): Payer: Self-pay

## 2023-07-20 ENCOUNTER — Other Ambulatory Visit (HOSPITAL_BASED_OUTPATIENT_CLINIC_OR_DEPARTMENT_OTHER): Payer: Self-pay

## 2023-07-21 ENCOUNTER — Other Ambulatory Visit (HOSPITAL_BASED_OUTPATIENT_CLINIC_OR_DEPARTMENT_OTHER): Payer: Self-pay

## 2023-07-22 ENCOUNTER — Other Ambulatory Visit (HOSPITAL_BASED_OUTPATIENT_CLINIC_OR_DEPARTMENT_OTHER): Payer: Self-pay

## 2023-07-25 ENCOUNTER — Other Ambulatory Visit (HOSPITAL_BASED_OUTPATIENT_CLINIC_OR_DEPARTMENT_OTHER): Payer: Self-pay

## 2023-07-26 ENCOUNTER — Other Ambulatory Visit (HOSPITAL_BASED_OUTPATIENT_CLINIC_OR_DEPARTMENT_OTHER): Payer: Self-pay

## 2023-07-27 ENCOUNTER — Other Ambulatory Visit (HOSPITAL_BASED_OUTPATIENT_CLINIC_OR_DEPARTMENT_OTHER): Payer: Self-pay

## 2023-07-27 ENCOUNTER — Other Ambulatory Visit: Payer: Self-pay

## 2023-07-28 ENCOUNTER — Other Ambulatory Visit (HOSPITAL_BASED_OUTPATIENT_CLINIC_OR_DEPARTMENT_OTHER): Payer: Self-pay

## 2023-07-29 ENCOUNTER — Other Ambulatory Visit (HOSPITAL_BASED_OUTPATIENT_CLINIC_OR_DEPARTMENT_OTHER): Payer: Self-pay

## 2023-08-01 ENCOUNTER — Other Ambulatory Visit (HOSPITAL_BASED_OUTPATIENT_CLINIC_OR_DEPARTMENT_OTHER): Payer: Self-pay

## 2023-08-01 ENCOUNTER — Other Ambulatory Visit: Payer: Self-pay

## 2023-08-02 ENCOUNTER — Other Ambulatory Visit (HOSPITAL_BASED_OUTPATIENT_CLINIC_OR_DEPARTMENT_OTHER): Payer: Self-pay

## 2023-08-12 ENCOUNTER — Other Ambulatory Visit (HOSPITAL_BASED_OUTPATIENT_CLINIC_OR_DEPARTMENT_OTHER): Payer: Self-pay

## 2023-08-12 MED ORDER — TRETINOIN 0.025 % EX CREA
1.0000 | TOPICAL_CREAM | Freq: Every day | CUTANEOUS | 4 refills | Status: AC
Start: 2023-08-12 — End: ?
  Filled 2023-08-12: qty 45, 30d supply, fill #0

## 2023-08-12 MED ORDER — ZEPBOUND 15 MG/0.5ML ~~LOC~~ SOAJ
15.0000 mg | SUBCUTANEOUS | 0 refills | Status: DC
  Filled 2023-08-12 – 2023-08-26 (×2): qty 2, 28d supply, fill #0

## 2023-08-12 MED ORDER — ERGOCALCIFEROL 1.25 MG (50000 UT) PO CAPS
50000.0000 [IU] | ORAL_CAPSULE | ORAL | 0 refills | Status: AC
  Filled 2023-08-12: qty 12, 84d supply, fill #0

## 2023-08-15 ENCOUNTER — Other Ambulatory Visit (HOSPITAL_BASED_OUTPATIENT_CLINIC_OR_DEPARTMENT_OTHER): Payer: Self-pay

## 2023-08-16 ENCOUNTER — Other Ambulatory Visit (HOSPITAL_BASED_OUTPATIENT_CLINIC_OR_DEPARTMENT_OTHER): Payer: Self-pay

## 2023-08-17 ENCOUNTER — Other Ambulatory Visit (HOSPITAL_BASED_OUTPATIENT_CLINIC_OR_DEPARTMENT_OTHER): Payer: Self-pay

## 2023-08-26 ENCOUNTER — Other Ambulatory Visit (HOSPITAL_BASED_OUTPATIENT_CLINIC_OR_DEPARTMENT_OTHER): Payer: Self-pay

## 2023-09-11 ENCOUNTER — Other Ambulatory Visit (HOSPITAL_BASED_OUTPATIENT_CLINIC_OR_DEPARTMENT_OTHER): Payer: Self-pay

## 2023-09-14 ENCOUNTER — Other Ambulatory Visit (HOSPITAL_BASED_OUTPATIENT_CLINIC_OR_DEPARTMENT_OTHER): Payer: Self-pay

## 2023-09-14 MED ORDER — ZEPBOUND 15 MG/0.5ML ~~LOC~~ SOAJ
15.0000 mg | SUBCUTANEOUS | 84 refills | Status: AC
Start: 2023-09-14 — End: ?
  Filled 2023-09-14 – 2023-09-19 (×2): qty 6, 84d supply, fill #0
  Filled 2023-12-19: qty 6, 84d supply, fill #1

## 2023-09-14 MED ORDER — TRETINOIN 0.05 % EX CREA
1.0000 g | TOPICAL_CREAM | Freq: Two times a day (BID) | CUTANEOUS | 0 refills | Status: AC
  Filled 2023-09-14: qty 20, 30d supply, fill #0

## 2023-09-14 MED ORDER — TRETINOIN 0.05 % EX CREA
1.0000 g | TOPICAL_CREAM | Freq: Every day | CUTANEOUS | 0 refills | Status: AC
  Filled 2023-09-14: qty 20, 15d supply, fill #0
  Filled 2023-11-23: qty 20, 30d supply, fill #0

## 2023-09-15 ENCOUNTER — Other Ambulatory Visit (HOSPITAL_BASED_OUTPATIENT_CLINIC_OR_DEPARTMENT_OTHER): Payer: Self-pay

## 2023-09-16 ENCOUNTER — Other Ambulatory Visit (HOSPITAL_BASED_OUTPATIENT_CLINIC_OR_DEPARTMENT_OTHER): Payer: Self-pay

## 2023-09-19 ENCOUNTER — Other Ambulatory Visit (HOSPITAL_BASED_OUTPATIENT_CLINIC_OR_DEPARTMENT_OTHER): Payer: Self-pay

## 2023-09-20 ENCOUNTER — Other Ambulatory Visit (HOSPITAL_BASED_OUTPATIENT_CLINIC_OR_DEPARTMENT_OTHER): Payer: Self-pay

## 2023-09-21 ENCOUNTER — Other Ambulatory Visit (HOSPITAL_BASED_OUTPATIENT_CLINIC_OR_DEPARTMENT_OTHER): Payer: Self-pay

## 2023-09-22 ENCOUNTER — Other Ambulatory Visit (HOSPITAL_BASED_OUTPATIENT_CLINIC_OR_DEPARTMENT_OTHER): Payer: Self-pay

## 2023-09-23 ENCOUNTER — Other Ambulatory Visit: Payer: Self-pay

## 2023-09-23 ENCOUNTER — Other Ambulatory Visit (HOSPITAL_BASED_OUTPATIENT_CLINIC_OR_DEPARTMENT_OTHER): Payer: Self-pay

## 2023-09-28 ENCOUNTER — Other Ambulatory Visit (HOSPITAL_BASED_OUTPATIENT_CLINIC_OR_DEPARTMENT_OTHER): Payer: Self-pay

## 2023-09-28 MED ORDER — FUROSEMIDE 20 MG PO TABS
20.0000 mg | ORAL_TABLET | Freq: Every day | ORAL | 0 refills | Status: DC
  Filled 2023-09-28: qty 90, 90d supply, fill #0

## 2023-10-18 ENCOUNTER — Other Ambulatory Visit (HOSPITAL_BASED_OUTPATIENT_CLINIC_OR_DEPARTMENT_OTHER): Payer: Self-pay

## 2023-11-16 ENCOUNTER — Other Ambulatory Visit (HOSPITAL_BASED_OUTPATIENT_CLINIC_OR_DEPARTMENT_OTHER): Payer: Self-pay

## 2023-11-17 ENCOUNTER — Other Ambulatory Visit: Payer: Self-pay

## 2023-11-18 ENCOUNTER — Other Ambulatory Visit (HOSPITAL_BASED_OUTPATIENT_CLINIC_OR_DEPARTMENT_OTHER): Payer: Self-pay

## 2023-11-23 ENCOUNTER — Other Ambulatory Visit (HOSPITAL_BASED_OUTPATIENT_CLINIC_OR_DEPARTMENT_OTHER): Payer: Self-pay

## 2023-11-23 ENCOUNTER — Other Ambulatory Visit: Payer: Self-pay

## 2023-11-24 ENCOUNTER — Other Ambulatory Visit (HOSPITAL_BASED_OUTPATIENT_CLINIC_OR_DEPARTMENT_OTHER): Payer: Self-pay

## 2023-11-25 ENCOUNTER — Other Ambulatory Visit (HOSPITAL_BASED_OUTPATIENT_CLINIC_OR_DEPARTMENT_OTHER): Payer: Self-pay

## 2023-11-25 MED ORDER — ZEPBOUND 12.5 MG/0.5ML ~~LOC~~ SOAJ
12.5000 mg | SUBCUTANEOUS | 0 refills | Status: AC
  Filled 2023-11-25: qty 2, 28d supply, fill #0

## 2023-11-25 MED ORDER — FUROSEMIDE 20 MG PO TABS: 20.0000 mg | ORAL_TABLET | Freq: Every day | ORAL | 0 refills | Status: AC

## 2023-11-25 MED ORDER — ATROVENT HFA 17 MCG/ACT IN AERS
INHALATION_SPRAY | Freq: Four times a day (QID) | RESPIRATORY_TRACT | 3 refills | Status: AC | PRN
  Filled 2023-11-25: qty 12.9, 30d supply, fill #0

## 2023-11-25 MED ORDER — ZEPBOUND 12.5 MG/0.5ML ~~LOC~~ SOAJ: 0.5000 mL | SUBCUTANEOUS | 0 refills | Status: AC

## 2023-11-28 ENCOUNTER — Other Ambulatory Visit (HOSPITAL_BASED_OUTPATIENT_CLINIC_OR_DEPARTMENT_OTHER): Payer: Self-pay

## 2023-11-30 ENCOUNTER — Other Ambulatory Visit (HOSPITAL_BASED_OUTPATIENT_CLINIC_OR_DEPARTMENT_OTHER): Payer: Self-pay

## 2023-12-19 ENCOUNTER — Other Ambulatory Visit (HOSPITAL_BASED_OUTPATIENT_CLINIC_OR_DEPARTMENT_OTHER): Payer: Self-pay

## 2023-12-26 ENCOUNTER — Other Ambulatory Visit (HOSPITAL_BASED_OUTPATIENT_CLINIC_OR_DEPARTMENT_OTHER): Payer: Self-pay

## 2023-12-26 ENCOUNTER — Other Ambulatory Visit: Payer: Self-pay

## 2023-12-26 MED ORDER — FUROSEMIDE 20 MG PO TABS
20.0000 mg | ORAL_TABLET | Freq: Every day | ORAL | 0 refills | Status: DC
  Filled 2023-12-26: qty 90, 90d supply, fill #0

## 2023-12-26 MED ORDER — VITAMIN D (ERGOCALCIFEROL) 1.25 MG (50000 UNIT) PO CAPS
50000.0000 [IU] | ORAL_CAPSULE | ORAL | 0 refills | Status: AC
Start: 2023-12-26 — End: ?
  Filled 2023-12-26 – 2024-01-26 (×2): qty 12, 84d supply, fill #0

## 2023-12-26 MED ORDER — ZEPBOUND 12.5 MG/0.5ML ~~LOC~~ SOAJ
12.5000 mg | SUBCUTANEOUS | 1 refills | Status: AC
  Filled 2023-12-26: qty 2, 28d supply, fill #0

## 2023-12-26 MED ORDER — CYCLOBENZAPRINE HCL 5 MG PO TABS
ORAL_TABLET | Freq: Three times a day (TID) | ORAL | 1 refills | Status: AC | PRN
  Filled 2023-12-26: qty 90, 15d supply, fill #0

## 2024-01-26 ENCOUNTER — Other Ambulatory Visit: Payer: Self-pay

## 2024-01-26 ENCOUNTER — Other Ambulatory Visit (HOSPITAL_BASED_OUTPATIENT_CLINIC_OR_DEPARTMENT_OTHER): Payer: Self-pay

## 2024-01-26 MED ORDER — TOPIRAMATE 25 MG PO TABS
ORAL_TABLET | ORAL | 0 refills | Status: AC
  Filled 2024-01-26: qty 72, 28d supply, fill #0

## 2024-01-26 MED ORDER — OZEMPIC (0.25 OR 0.5 MG/DOSE) 2 MG/3ML ~~LOC~~ SOPN
0.2500 mg | PEN_INJECTOR | SUBCUTANEOUS | 1 refills | Status: AC
  Filled 2024-01-26: qty 3, 56d supply, fill #0

## 2024-01-26 MED ORDER — ZEPBOUND 12.5 MG/0.5ML ~~LOC~~ SOAJ
12.5000 mg | SUBCUTANEOUS | 1 refills | Status: AC
  Filled 2024-01-26: qty 6, 84d supply, fill #0

## 2024-01-27 ENCOUNTER — Other Ambulatory Visit: Payer: Self-pay

## 2024-02-15 ENCOUNTER — Other Ambulatory Visit (HOSPITAL_BASED_OUTPATIENT_CLINIC_OR_DEPARTMENT_OTHER): Payer: Self-pay

## 2024-02-23 ENCOUNTER — Other Ambulatory Visit (HOSPITAL_BASED_OUTPATIENT_CLINIC_OR_DEPARTMENT_OTHER): Payer: Self-pay

## 2024-03-08 ENCOUNTER — Other Ambulatory Visit: Payer: Self-pay

## 2024-03-08 ENCOUNTER — Other Ambulatory Visit (HOSPITAL_BASED_OUTPATIENT_CLINIC_OR_DEPARTMENT_OTHER): Payer: Self-pay

## 2024-03-08 MED ORDER — TRETINOIN 0.05 % EX CREA
TOPICAL_CREAM | CUTANEOUS | 5 refills | Status: AC
  Filled 2024-03-08: qty 20, 30d supply, fill #0
  Filled 2024-04-12 – 2024-04-24 (×3): qty 20, 30d supply, fill #1

## 2024-03-08 MED ORDER — IBUPROFEN 800 MG PO TABS
800.0000 mg | ORAL_TABLET | Freq: Two times a day (BID) | ORAL | 3 refills | Status: AC
  Filled 2024-03-08: qty 60, 30d supply, fill #0
  Filled 2024-04-12 (×2): qty 60, 30d supply, fill #1
  Filled 2024-05-21: qty 60, 30d supply, fill #2

## 2024-03-09 ENCOUNTER — Other Ambulatory Visit (HOSPITAL_BASED_OUTPATIENT_CLINIC_OR_DEPARTMENT_OTHER): Payer: Self-pay

## 2024-03-09 ENCOUNTER — Other Ambulatory Visit: Payer: Self-pay

## 2024-04-05 ENCOUNTER — Other Ambulatory Visit (HOSPITAL_BASED_OUTPATIENT_CLINIC_OR_DEPARTMENT_OTHER): Payer: Self-pay

## 2024-04-05 MED ORDER — FUROSEMIDE 20 MG PO TABS
20.0000 mg | ORAL_TABLET | Freq: Every day | ORAL | 0 refills | Status: AC
  Filled 2024-04-05: qty 90, 90d supply, fill #0

## 2024-04-12 ENCOUNTER — Other Ambulatory Visit: Payer: Self-pay

## 2024-04-12 ENCOUNTER — Other Ambulatory Visit (HOSPITAL_BASED_OUTPATIENT_CLINIC_OR_DEPARTMENT_OTHER): Payer: Self-pay

## 2024-04-12 MED ORDER — BENZONATATE 200 MG PO CAPS
200.0000 mg | ORAL_CAPSULE | Freq: Three times a day (TID) | ORAL | 0 refills | Status: AC | PRN
  Filled 2024-04-12: qty 21, 7d supply, fill #0

## 2024-04-12 MED ORDER — ZEPBOUND 15 MG/0.5ML ~~LOC~~ SOAJ
15.0000 mg | SUBCUTANEOUS | 0 refills | Status: AC
  Filled 2024-04-12 – 2024-04-13 (×2): qty 2, 28d supply, fill #0

## 2024-04-12 MED ORDER — AZITHROMYCIN 250 MG PO TABS
ORAL_TABLET | ORAL | 0 refills | Status: AC
  Filled 2024-04-12: qty 6, 5d supply, fill #0

## 2024-04-13 ENCOUNTER — Other Ambulatory Visit (HOSPITAL_BASED_OUTPATIENT_CLINIC_OR_DEPARTMENT_OTHER): Payer: Self-pay

## 2024-04-16 ENCOUNTER — Other Ambulatory Visit (HOSPITAL_BASED_OUTPATIENT_CLINIC_OR_DEPARTMENT_OTHER): Payer: Self-pay

## 2024-04-17 ENCOUNTER — Other Ambulatory Visit (HOSPITAL_BASED_OUTPATIENT_CLINIC_OR_DEPARTMENT_OTHER): Payer: Self-pay

## 2024-04-18 ENCOUNTER — Other Ambulatory Visit (HOSPITAL_BASED_OUTPATIENT_CLINIC_OR_DEPARTMENT_OTHER): Payer: Self-pay

## 2024-04-19 ENCOUNTER — Other Ambulatory Visit (HOSPITAL_BASED_OUTPATIENT_CLINIC_OR_DEPARTMENT_OTHER): Payer: Self-pay

## 2024-04-24 ENCOUNTER — Other Ambulatory Visit (HOSPITAL_BASED_OUTPATIENT_CLINIC_OR_DEPARTMENT_OTHER): Payer: Self-pay

## 2024-04-25 ENCOUNTER — Other Ambulatory Visit (HOSPITAL_BASED_OUTPATIENT_CLINIC_OR_DEPARTMENT_OTHER): Payer: Self-pay

## 2024-04-30 ENCOUNTER — Other Ambulatory Visit (HOSPITAL_BASED_OUTPATIENT_CLINIC_OR_DEPARTMENT_OTHER): Payer: Self-pay

## 2024-05-01 ENCOUNTER — Other Ambulatory Visit (HOSPITAL_BASED_OUTPATIENT_CLINIC_OR_DEPARTMENT_OTHER): Payer: Self-pay

## 2024-05-11 ENCOUNTER — Other Ambulatory Visit (HOSPITAL_BASED_OUTPATIENT_CLINIC_OR_DEPARTMENT_OTHER): Payer: Self-pay

## 2024-05-14 ENCOUNTER — Other Ambulatory Visit (HOSPITAL_BASED_OUTPATIENT_CLINIC_OR_DEPARTMENT_OTHER): Payer: Self-pay

## 2024-05-21 ENCOUNTER — Other Ambulatory Visit (HOSPITAL_BASED_OUTPATIENT_CLINIC_OR_DEPARTMENT_OTHER): Payer: Self-pay

## 2024-05-24 ENCOUNTER — Other Ambulatory Visit (HOSPITAL_BASED_OUTPATIENT_CLINIC_OR_DEPARTMENT_OTHER): Payer: Self-pay

## 2024-05-24 MED ORDER — ZEPBOUND 15 MG/0.5ML ~~LOC~~ SOAJ
15.0000 mg | SUBCUTANEOUS | 0 refills | Status: AC
  Filled 2024-05-24 – 2024-07-02 (×2): qty 6, 84d supply, fill #0

## 2024-05-24 MED ORDER — EPINEPHRINE 0.3 MG/0.3ML IJ SOAJ
INTRAMUSCULAR | 0 refills | Status: AC
  Filled 2024-05-24: qty 2, 30d supply, fill #0

## 2024-05-24 MED ORDER — VITAMIN D (ERGOCALCIFEROL) 1.25 MG (50000 UNIT) PO CAPS
50000.0000 [IU] | ORAL_CAPSULE | ORAL | 0 refills | Status: DC
  Filled 2024-05-24: qty 12, 84d supply, fill #0

## 2024-06-28 ENCOUNTER — Other Ambulatory Visit (HOSPITAL_BASED_OUTPATIENT_CLINIC_OR_DEPARTMENT_OTHER): Payer: Self-pay

## 2024-06-28 MED ORDER — CYCLOBENZAPRINE HCL 5 MG PO TABS
5.0000 mg | ORAL_TABLET | Freq: Three times a day (TID) | ORAL | 1 refills | Status: AC | PRN
  Filled 2024-06-28: qty 90, 15d supply, fill #0

## 2024-06-28 MED ORDER — TRETINOIN 0.05 % EX CREA
1.0000 | TOPICAL_CREAM | Freq: Every day | CUTANEOUS | 0 refills | Status: AC
  Filled 2024-06-28: qty 20, 30d supply, fill #0

## 2024-07-02 ENCOUNTER — Other Ambulatory Visit: Payer: Self-pay

## 2024-07-04 ENCOUNTER — Other Ambulatory Visit (HOSPITAL_BASED_OUTPATIENT_CLINIC_OR_DEPARTMENT_OTHER): Payer: Self-pay

## 2024-07-06 ENCOUNTER — Other Ambulatory Visit (HOSPITAL_BASED_OUTPATIENT_CLINIC_OR_DEPARTMENT_OTHER): Payer: Self-pay

## 2024-07-09 ENCOUNTER — Other Ambulatory Visit (HOSPITAL_BASED_OUTPATIENT_CLINIC_OR_DEPARTMENT_OTHER): Payer: Self-pay

## 2024-07-10 ENCOUNTER — Other Ambulatory Visit: Payer: Self-pay

## 2024-07-10 ENCOUNTER — Other Ambulatory Visit (HOSPITAL_BASED_OUTPATIENT_CLINIC_OR_DEPARTMENT_OTHER): Payer: Self-pay

## 2024-07-10 MED ORDER — TRETINOIN 0.05 % EX CREA: TOPICAL_CREAM | Freq: Every day | CUTANEOUS | 3 refills | Status: AC

## 2024-07-10 MED ORDER — ZEPBOUND 15 MG/0.5ML ~~LOC~~ SOAJ
15.0000 mg | SUBCUTANEOUS | 0 refills | Status: AC
  Filled 2024-07-10 (×3): qty 2, 28d supply, fill #0
  Filled 2024-07-11: qty 6, 84d supply, fill #0

## 2024-07-10 MED ORDER — VITAMIN D (ERGOCALCIFEROL) 1.25 MG (50000 UNIT) PO CAPS
ORAL_CAPSULE | ORAL | 0 refills | Status: AC
  Filled 2024-07-10: qty 12, 83d supply, fill #0

## 2024-07-11 ENCOUNTER — Other Ambulatory Visit: Payer: Self-pay

## 2024-07-11 ENCOUNTER — Other Ambulatory Visit (HOSPITAL_BASED_OUTPATIENT_CLINIC_OR_DEPARTMENT_OTHER): Payer: Self-pay
